# Patient Record
Sex: Male | Born: 1945 | Hispanic: Yes | Smoking: Former smoker
Health system: Southern US, Community
[De-identification: ages and names within clinical notes are randomized; demographics above are authoritative.]

## PROBLEM LIST (undated history)

## (undated) DIAGNOSIS — I10 Essential (primary) hypertension: Secondary | ICD-10-CM

## (undated) DIAGNOSIS — R0600 Dyspnea, unspecified: Secondary | ICD-10-CM

## (undated) DIAGNOSIS — E7800 Pure hypercholesterolemia, unspecified: Secondary | ICD-10-CM

## (undated) DIAGNOSIS — E119 Type 2 diabetes mellitus without complications: Secondary | ICD-10-CM

## (undated) HISTORY — PX: HERNIA REPAIR: SHX51

---

## 2002-10-18 ENCOUNTER — Encounter: Payer: Self-pay | Admitting: Orthopaedic Surgery

## 2002-10-18 ENCOUNTER — Encounter: Admission: RE | Admit: 2002-10-18 | Discharge: 2002-10-18 | Payer: Self-pay | Admitting: Orthopaedic Surgery

## 2005-10-01 ENCOUNTER — Ambulatory Visit (HOSPITAL_COMMUNITY): Admission: RE | Admit: 2005-10-01 | Discharge: 2005-10-01 | Payer: Self-pay | Admitting: General Surgery

## 2007-09-03 ENCOUNTER — Emergency Department: Payer: Self-pay | Admitting: Emergency Medicine

## 2011-03-06 ENCOUNTER — Emergency Department: Payer: Self-pay | Admitting: Emergency Medicine

## 2014-04-16 ENCOUNTER — Encounter: Payer: Self-pay | Admitting: Physician Assistant

## 2014-04-25 ENCOUNTER — Encounter: Payer: Self-pay | Admitting: Physician Assistant

## 2014-05-26 ENCOUNTER — Encounter: Payer: Self-pay | Admitting: Physician Assistant

## 2016-05-16 ENCOUNTER — Observation Stay
Admission: EM | Admit: 2016-05-16 | Discharge: 2016-05-17 | Disposition: A | Discharge status: Home / Self Care | Payer: Medicare Other | Attending: Internal Medicine | Admitting: Internal Medicine

## 2016-05-16 ENCOUNTER — Emergency Department: Payer: Medicare Other

## 2016-05-16 ENCOUNTER — Observation Stay (HOSPITAL_BASED_OUTPATIENT_CLINIC_OR_DEPARTMENT_OTHER)
Admit: 2016-05-16 | Discharge: 2016-05-16 | Disposition: A | Discharge status: Home / Self Care | Payer: Medicare Other | Attending: Internal Medicine | Admitting: Internal Medicine

## 2016-05-16 ENCOUNTER — Observation Stay: Payer: Medicare Other

## 2016-05-16 ENCOUNTER — Encounter: Payer: Self-pay | Admitting: Emergency Medicine

## 2016-05-16 DIAGNOSIS — Z833 Family history of diabetes mellitus: Secondary | ICD-10-CM | POA: Diagnosis not present

## 2016-05-16 DIAGNOSIS — R079 Chest pain, unspecified: Secondary | ICD-10-CM | POA: Clinically undetermined

## 2016-05-16 DIAGNOSIS — F0390 Unspecified dementia without behavioral disturbance: Secondary | ICD-10-CM | POA: Insufficient documentation

## 2016-05-16 DIAGNOSIS — E785 Hyperlipidemia, unspecified: Secondary | ICD-10-CM | POA: Insufficient documentation

## 2016-05-16 DIAGNOSIS — I119 Hypertensive heart disease without heart failure: Secondary | ICD-10-CM | POA: Diagnosis not present

## 2016-05-16 DIAGNOSIS — I2510 Atherosclerotic heart disease of native coronary artery without angina pectoris: Secondary | ICD-10-CM | POA: Diagnosis present

## 2016-05-16 DIAGNOSIS — Z8249 Family history of ischemic heart disease and other diseases of the circulatory system: Secondary | ICD-10-CM | POA: Diagnosis not present

## 2016-05-16 DIAGNOSIS — E669 Obesity, unspecified: Secondary | ICD-10-CM | POA: Insufficient documentation

## 2016-05-16 DIAGNOSIS — N400 Enlarged prostate without lower urinary tract symptoms: Secondary | ICD-10-CM | POA: Insufficient documentation

## 2016-05-16 DIAGNOSIS — Z79899 Other long term (current) drug therapy: Secondary | ICD-10-CM | POA: Diagnosis not present

## 2016-05-16 DIAGNOSIS — Z794 Long term (current) use of insulin: Secondary | ICD-10-CM | POA: Diagnosis not present

## 2016-05-16 DIAGNOSIS — I10 Essential (primary) hypertension: Secondary | ICD-10-CM | POA: Diagnosis not present

## 2016-05-16 DIAGNOSIS — R1011 Right upper quadrant pain: Secondary | ICD-10-CM | POA: Diagnosis present

## 2016-05-16 DIAGNOSIS — E1169 Type 2 diabetes mellitus with other specified complication: Secondary | ICD-10-CM | POA: Diagnosis present

## 2016-05-16 DIAGNOSIS — E119 Type 2 diabetes mellitus without complications: Secondary | ICD-10-CM | POA: Diagnosis not present

## 2016-05-16 DIAGNOSIS — R0789 Other chest pain: Principal | ICD-10-CM | POA: Insufficient documentation

## 2016-05-16 DIAGNOSIS — Z6837 Body mass index (BMI) 37.0-37.9, adult: Secondary | ICD-10-CM | POA: Diagnosis not present

## 2016-05-16 DIAGNOSIS — R202 Paresthesia of skin: Secondary | ICD-10-CM | POA: Diagnosis present

## 2016-05-16 DIAGNOSIS — Z87891 Personal history of nicotine dependence: Secondary | ICD-10-CM | POA: Insufficient documentation

## 2016-05-16 DIAGNOSIS — I454 Nonspecific intraventricular block: Secondary | ICD-10-CM | POA: Insufficient documentation

## 2016-05-16 DIAGNOSIS — I340 Nonrheumatic mitral (valve) insufficiency: Secondary | ICD-10-CM | POA: Insufficient documentation

## 2016-05-16 DIAGNOSIS — I700 Atherosclerosis of aorta: Secondary | ICD-10-CM | POA: Diagnosis not present

## 2016-05-16 DIAGNOSIS — I440 Atrioventricular block, first degree: Secondary | ICD-10-CM | POA: Insufficient documentation

## 2016-05-16 DIAGNOSIS — E7800 Pure hypercholesterolemia, unspecified: Secondary | ICD-10-CM | POA: Insufficient documentation

## 2016-05-16 DIAGNOSIS — R109 Unspecified abdominal pain: Secondary | ICD-10-CM

## 2016-05-16 DIAGNOSIS — Z7982 Long term (current) use of aspirin: Secondary | ICD-10-CM | POA: Insufficient documentation

## 2016-05-16 HISTORY — DX: Essential (primary) hypertension: I10

## 2016-05-16 HISTORY — DX: Pure hypercholesterolemia, unspecified: E78.00

## 2016-05-16 HISTORY — DX: Type 2 diabetes mellitus without complications: E11.9

## 2016-05-16 LAB — COMPREHENSIVE METABOLIC PANEL
ALBUMIN: 4.1 g/dL (ref 3.5–5.0)
ALK PHOS: 57 U/L (ref 38–126)
ALT: 26 U/L (ref 17–63)
AST: 32 U/L (ref 15–41)
Anion gap: 5 (ref 5–15)
BUN: 17 mg/dL (ref 6–20)
CALCIUM: 9.4 mg/dL (ref 8.9–10.3)
CHLORIDE: 100 mmol/L — AB (ref 101–111)
CO2: 32 mmol/L (ref 22–32)
CREATININE: 1.31 mg/dL — AB (ref 0.61–1.24)
GFR calc non Af Amer: 54 mL/min — ABNORMAL LOW (ref >60)
GLUCOSE: 124 mg/dL — AB (ref 65–99)
Potassium: 4.2 mmol/L (ref 3.5–5.1)
SODIUM: 137 mmol/L (ref 135–145)
Total Bilirubin: 0.3 mg/dL (ref 0.3–1.2)
Total Protein: 8 g/dL (ref 6.5–8.1)

## 2016-05-16 LAB — CBC WITH DIFFERENTIAL/PLATELET
BASOS ABS: 0.1 10*3/uL (ref 0–0.1)
BASOS PCT: 2 %
EOS ABS: 0.4 10*3/uL (ref 0–0.7)
EOS PCT: 6 %
HCT: 49 % (ref 40.0–52.0)
HEMOGLOBIN: 16.9 g/dL (ref 13.0–18.0)
LYMPHS ABS: 2.5 10*3/uL (ref 1.0–3.6)
Lymphocytes Relative: 38 %
MCH: 30.5 pg (ref 26.0–34.0)
MCHC: 34.5 g/dL (ref 32.0–36.0)
MCV: 88.4 fL (ref 80.0–100.0)
MONOS PCT: 10 %
Monocytes Absolute: 0.7 10*3/uL (ref 0.2–1.0)
NEUTROS PCT: 44 %
Neutro Abs: 2.8 10*3/uL (ref 1.4–6.5)
PLATELETS: 217 10*3/uL (ref 150–440)
RBC: 5.54 MIL/uL (ref 4.40–5.90)
RDW: 13.9 % (ref 11.5–14.5)
WBC: 6.6 10*3/uL (ref 3.8–10.6)

## 2016-05-16 LAB — TROPONIN I
Troponin I: <0.03 ng/mL (ref <0.03)
Troponin I: <0.03 ng/mL (ref <0.03)
Troponin I: <0.03 ng/mL (ref <0.03)

## 2016-05-16 LAB — ECHOCARDIOGRAM COMPLETE
HEIGHTINCHES: 65 in
Weight: 3620.8 oz

## 2016-05-16 LAB — GLUCOSE, CAPILLARY
GLUCOSE-CAPILLARY: 93 mg/dL (ref 65–99)
GLUCOSE-CAPILLARY: 173 mg/dL — AB (ref 65–99)
Glucose-Capillary: 121 mg/dL — ABNORMAL HIGH (ref 65–99)
Glucose-Capillary: 105 mg/dL — ABNORMAL HIGH (ref 65–99)

## 2016-05-16 IMAGING — CT CT HEAD W/O CM
3 series · 16 of 47 positions shown, 19 images · non-contrast
Comparison: None.

CLINICAL DATA: Bilateral hand numbness starting [REDACTED].

EXAM:
CT HEAD WITHOUT CONTRAST
TECHNIQUE: Contiguous axial images were obtained from the base of the skull
through the vertex without intravenous contrast.

[Series 2: head wo · axial · 0.41mm/px · z∈[-153,-28]mm · 10 of 31 slices shown, 13 images]
[im 3/31  brain]
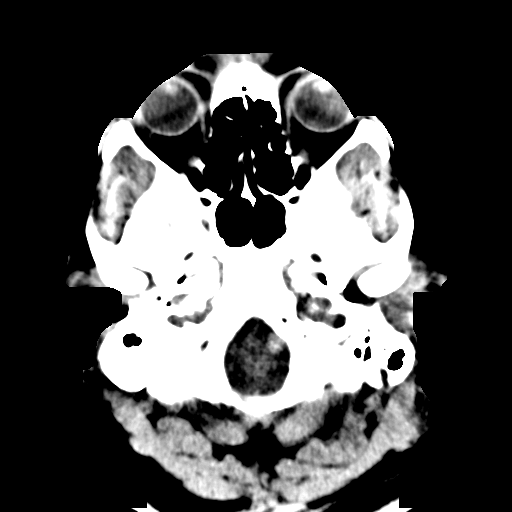
[im 3/31  bone]
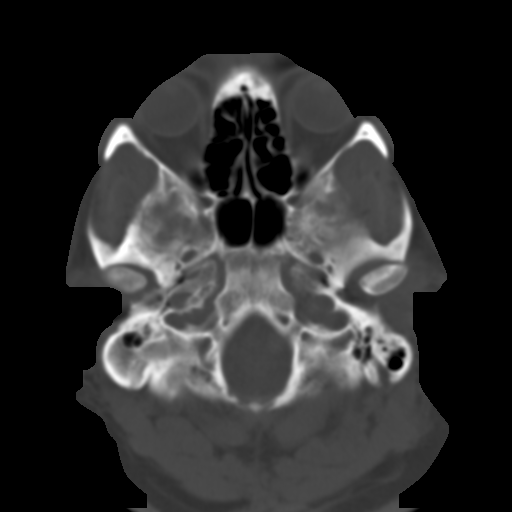
[im 6/31  brain]
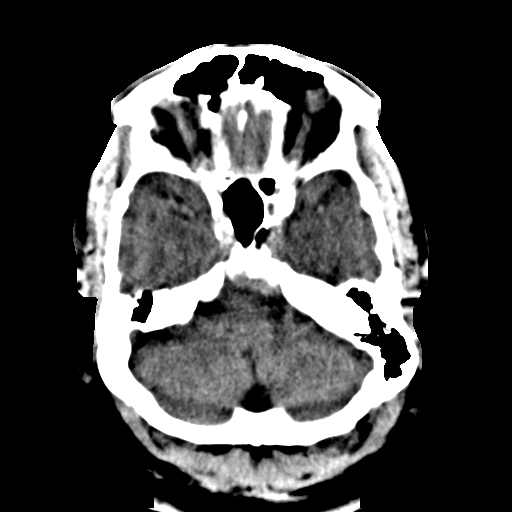
[im 9/31  brain]
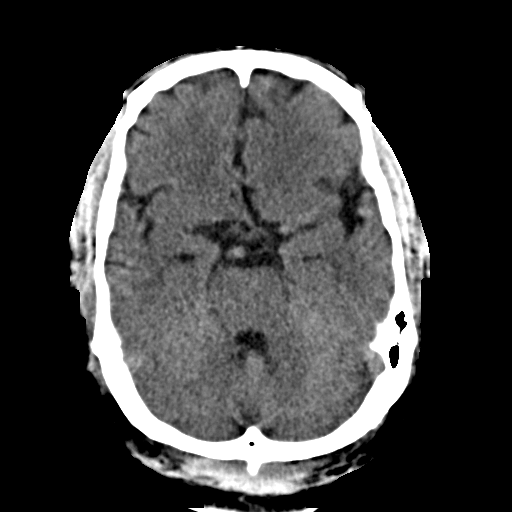
[im 11/31  brain]
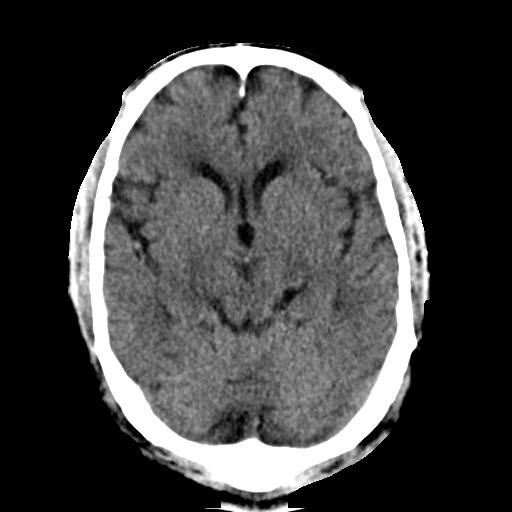
[im 14/31  brain]
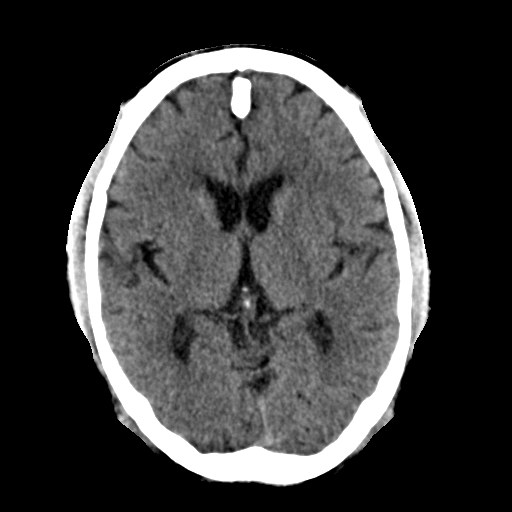
[im 14/31  bone]
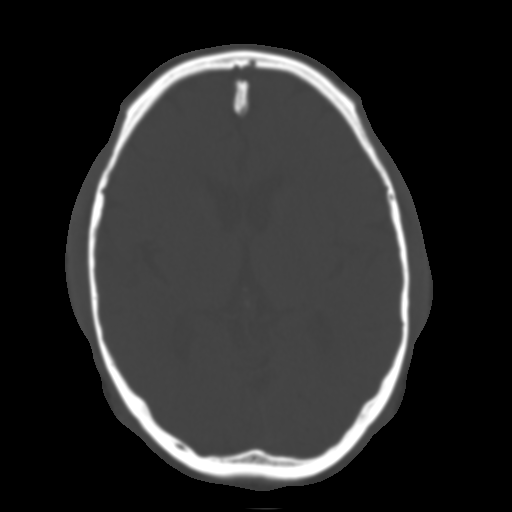
[im 17/31  brain]
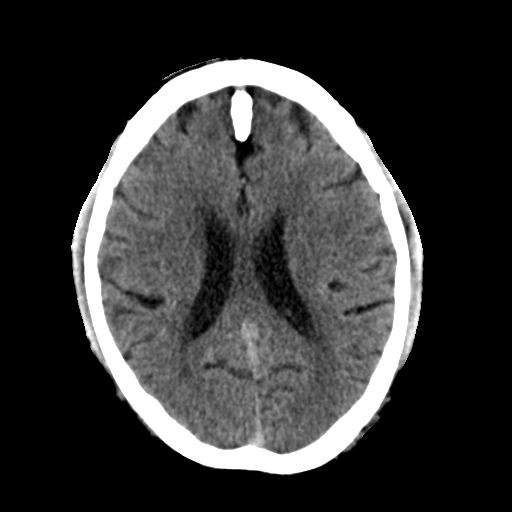
[im 20/31  brain]
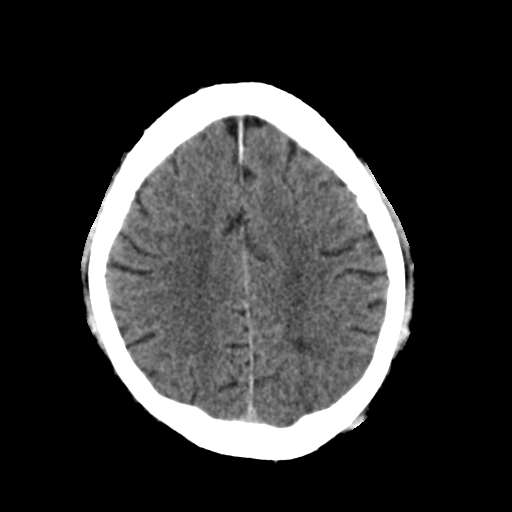
[im 23/31  brain]
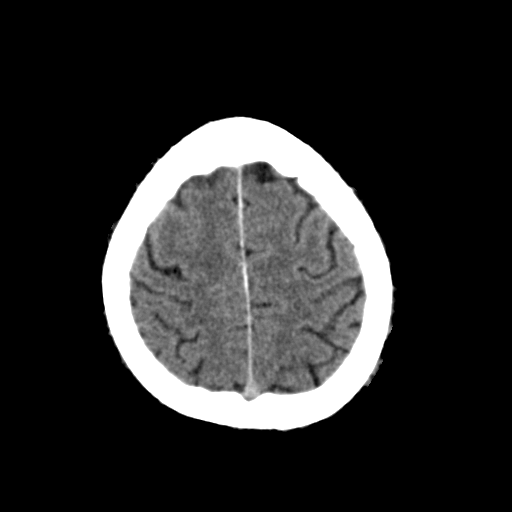
[im 25/31  brain]
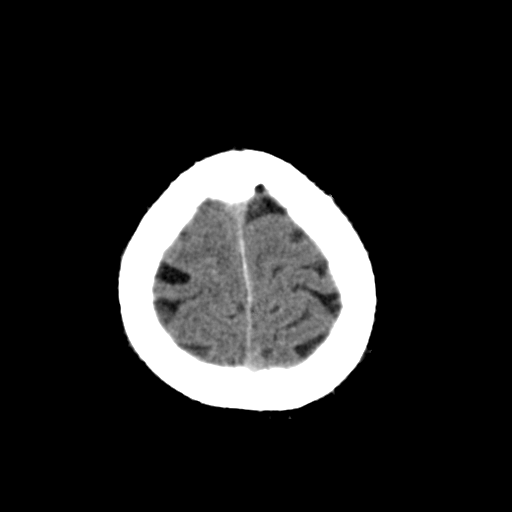
[im 25/31  bone]
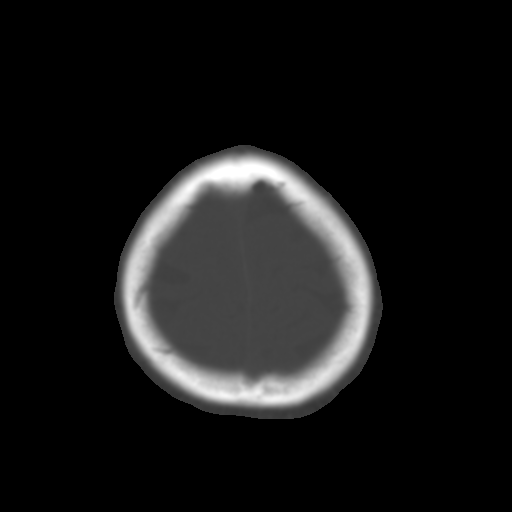
[im 28/31  brain]
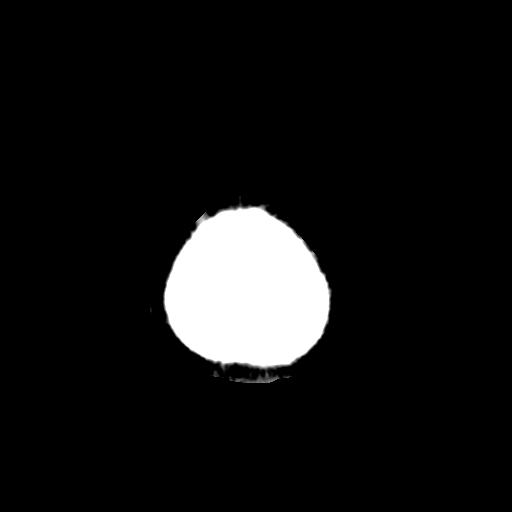

[Series 4: coronal soft · coronal · 0.30mm/px · 3 of 59 slices shown]
[im 20/59  brain]
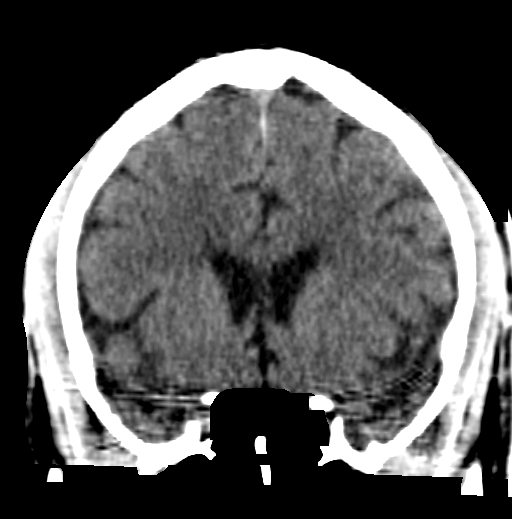
[im 26/59  brain]
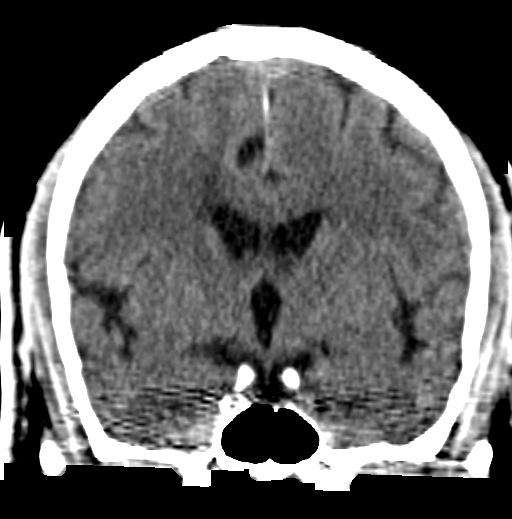
[im 33/59  brain]
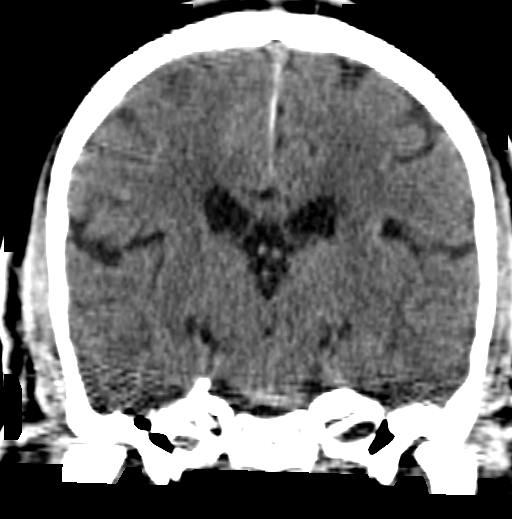

[Series 5: sagittal soft · sagittal · 0.30mm/px · 3 of 51 slices shown]
[im 17/51  brain]
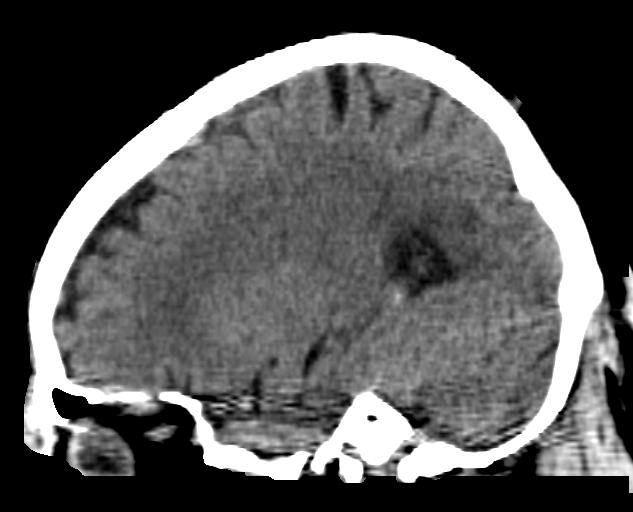
[im 26/51  brain]
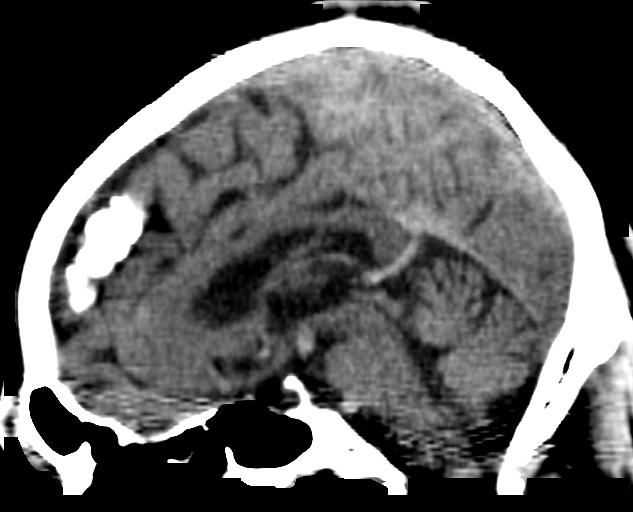
[im 34/51  brain]
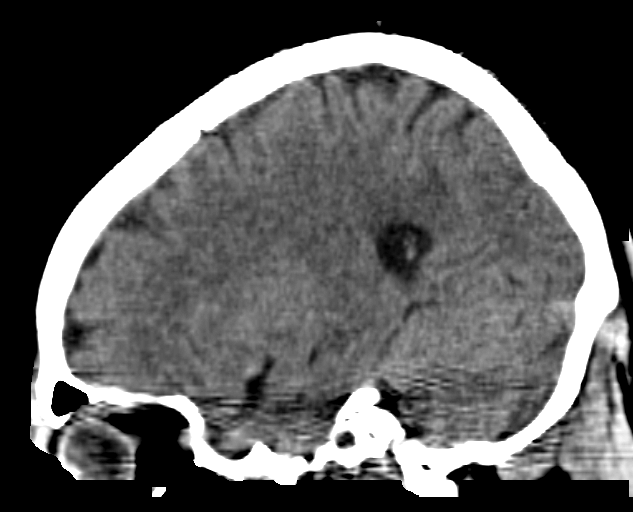

[16 of 47 positions shown; findings below may reference images not displayed]

FINDINGS: Brain: No evidence of acute infarction, hemorrhage, hydrocephalus,
or mass lesion/mass effect. Patchy chronic microvascular disease in
the cerebral white matter, mild to moderate.

Vascular: Vertebrobasilar tortuosity. Atherosclerotic calcification.
No focal hyperdense vessel.

Skull: Negative for fracture or focal lesion.

Sinuses/Orbits: No acute finding.

Other: None.
IMPRESSION: 1. No acute finding.
2. Chronic microvascular disease.

## 2016-05-16 MED ORDER — ONDANSETRON HCL 4 MG/2ML IJ SOLN
4.0000 mg | Freq: Four times a day (QID) | INTRAMUSCULAR | Status: DC | PRN
  Administered 2016-05-16 – 2016-05-17 (×2): 4 mg via INTRAVENOUS
  Filled 2016-05-16 (×2): qty 2

## 2016-05-16 MED ORDER — ONDANSETRON HCL 4 MG/2ML IJ SOLN
INTRAMUSCULAR | Status: AC
  Filled 2016-05-16: qty 2

## 2016-05-16 MED ORDER — ACETAMINOPHEN 325 MG PO TABS: 650.0000 mg | ORAL_TABLET | ORAL | Status: DC | PRN

## 2016-05-16 MED ORDER — NITROGLYCERIN 0.4 MG SL SUBL
0.4000 mg | SUBLINGUAL_TABLET | SUBLINGUAL | Status: DC | PRN
Start: 2016-05-16 — End: 2016-05-17

## 2016-05-16 MED ORDER — MORPHINE SULFATE (PF) 4 MG/ML IV SOLN
4.0000 mg | Freq: Once | INTRAVENOUS | Status: AC
  Administered 2016-05-16: 4 mg via INTRAVENOUS

## 2016-05-16 MED ORDER — SODIUM CHLORIDE 0.9 % IV SOLN: 250.0000 mL | INTRAVENOUS | Status: DC | PRN

## 2016-05-16 MED ORDER — DONEPEZIL HCL 5 MG PO TABS
10.0000 mg | ORAL_TABLET | Freq: Every day | ORAL | Status: DC
  Administered 2016-05-16 – 2016-05-17 (×2): 10 mg via ORAL
  Filled 2016-05-16 (×2): qty 2

## 2016-05-16 MED ORDER — IOPAMIDOL (ISOVUE-300) INJECTION 61%
100.0000 mL | Freq: Once | INTRAVENOUS | Status: AC | PRN
  Administered 2016-05-16: 100 mL via INTRAVENOUS

## 2016-05-16 MED ORDER — DIATRIZOATE MEGLUMINE & SODIUM 66-10 % PO SOLN
15.0000 mL | ORAL | Status: AC
  Administered 2016-05-16 (×2): 15 mL via ORAL

## 2016-05-16 MED ORDER — ENOXAPARIN SODIUM 40 MG/0.4ML ~~LOC~~ SOLN
40.0000 mg | SUBCUTANEOUS | Status: DC
  Administered 2016-05-16: 40 mg via SUBCUTANEOUS
  Filled 2016-05-16: qty 0.4

## 2016-05-16 MED ORDER — HYDRALAZINE HCL 20 MG/ML IJ SOLN
10.0000 mg | Freq: Four times a day (QID) | INTRAMUSCULAR | Status: DC | PRN
  Administered 2016-05-16: 10 mg via INTRAVENOUS

## 2016-05-16 MED ORDER — PROMETHAZINE HCL 25 MG/ML IJ SOLN
12.5000 mg | Freq: Four times a day (QID) | INTRAMUSCULAR | Status: DC | PRN
  Administered 2016-05-16: 12.5 mg via INTRAVENOUS
  Filled 2016-05-16: qty 1

## 2016-05-16 MED ORDER — ATORVASTATIN CALCIUM 20 MG PO TABS
40.0000 mg | ORAL_TABLET | Freq: Every day | ORAL | Status: DC
  Administered 2016-05-16 – 2016-05-17 (×2): 40 mg via ORAL
  Filled 2016-05-16 (×3): qty 2

## 2016-05-16 MED ORDER — CALCIUM CARBONATE-VITAMIN D 500-200 MG-UNIT PO TABS
1.0000 | ORAL_TABLET | Freq: Two times a day (BID) | ORAL | Status: DC
  Administered 2016-05-16 – 2016-05-17 (×3): 1 via ORAL
  Filled 2016-05-16 (×3): qty 1

## 2016-05-16 MED ORDER — INSULIN ASPART 100 UNIT/ML ~~LOC~~ SOLN
0.0000 [IU] | Freq: Three times a day (TID) | SUBCUTANEOUS | Status: DC
Start: 2016-05-16 — End: 2016-05-17

## 2016-05-16 MED ORDER — LISINOPRIL 20 MG PO TABS
40.0000 mg | ORAL_TABLET | Freq: Every day | ORAL | Status: DC
  Administered 2016-05-16 – 2016-05-17 (×2): 40 mg via ORAL
  Filled 2016-05-16 (×2): qty 2

## 2016-05-16 MED ORDER — ASPIRIN EC 81 MG PO TBEC
81.0000 mg | DELAYED_RELEASE_TABLET | Freq: Every day | ORAL | Status: DC
  Administered 2016-05-16 – 2016-05-17 (×2): 81 mg via ORAL
  Filled 2016-05-16 (×2): qty 1

## 2016-05-16 MED ORDER — CALCIUM 1200 1200-1000 MG-UNIT PO CHEW: CHEWABLE_TABLET | Freq: Every day | ORAL | Status: DC

## 2016-05-16 MED ORDER — ADULT MULTIVITAMIN W/MINERALS CH
1.0000 | ORAL_TABLET | Freq: Every day | ORAL | Status: DC
  Administered 2016-05-16 – 2016-05-17 (×2): 1 via ORAL
  Filled 2016-05-16 (×3): qty 1

## 2016-05-16 MED ORDER — SODIUM CHLORIDE 0.9% FLUSH
3.0000 mL | Freq: Two times a day (BID) | INTRAVENOUS | Status: DC
  Administered 2016-05-16 – 2016-05-17 (×3): 3 mL via INTRAVENOUS

## 2016-05-16 MED ORDER — ONDANSETRON HCL 4 MG/2ML IJ SOLN
4.0000 mg | Freq: Once | INTRAMUSCULAR | Status: AC
  Administered 2016-05-16: 4 mg via INTRAVENOUS

## 2016-05-16 MED ORDER — MORPHINE SULFATE (PF) 2 MG/ML IV SOLN
1.0000 mg | INTRAVENOUS | Status: DC | PRN
  Administered 2016-05-16: 1 mg via INTRAVENOUS
  Administered 2016-05-17: 2 mg via INTRAVENOUS
  Filled 2016-05-16 (×2): qty 1

## 2016-05-16 MED ORDER — MORPHINE SULFATE (PF) 4 MG/ML IV SOLN
INTRAVENOUS | Status: AC
  Filled 2016-05-16: qty 1

## 2016-05-16 MED ORDER — INSULIN ASPART 100 UNIT/ML ~~LOC~~ SOLN: 0.0000 [IU] | Freq: Every day | SUBCUTANEOUS | Status: DC

## 2016-05-16 MED ORDER — ASPIRIN 300 MG RE SUPP: 300.0000 mg | RECTAL | Status: AC

## 2016-05-16 MED ORDER — HYDRALAZINE HCL 20 MG/ML IJ SOLN
INTRAMUSCULAR | Status: AC
  Filled 2016-05-16: qty 1

## 2016-05-16 MED ORDER — ASPIRIN EC 81 MG PO TBEC: 81.0000 mg | DELAYED_RELEASE_TABLET | Freq: Every day | ORAL | Status: DC

## 2016-05-16 MED ORDER — QUINAPRIL HCL 10 MG PO TABS
40.0000 mg | ORAL_TABLET | Freq: Every day | ORAL | Status: DC
  Filled 2016-05-16: qty 4

## 2016-05-16 MED ORDER — SODIUM CHLORIDE 0.9% FLUSH: 3.0000 mL | INTRAVENOUS | Status: DC | PRN

## 2016-05-16 MED ORDER — ASPIRIN 81 MG PO CHEW
324.0000 mg | CHEWABLE_TABLET | ORAL | Status: AC
  Administered 2016-05-16: 324 mg via ORAL
  Filled 2016-05-16: qty 4

## 2016-05-16 MED ORDER — VITAMIN D 1000 UNITS PO TABS
1000.0000 [IU] | ORAL_TABLET | Freq: Every day | ORAL | Status: DC
  Administered 2016-05-16 – 2016-05-17 (×2): 1000 [IU] via ORAL
  Filled 2016-05-16 (×3): qty 1

## 2016-05-16 MED ORDER — ATENOLOL 25 MG PO TABS
25.0000 mg | ORAL_TABLET | Freq: Every day | ORAL | Status: DC
  Administered 2016-05-16: 25 mg via ORAL
  Filled 2016-05-16: qty 1

## 2016-05-16 NOTE — Consult Note (Signed)
Cardiology Consult    Patient ID: Tristan Larson MRN: [id number], DOB/AGE: December 29, 1945   Admit date: 05/16/2016 Date of Consult: 05/16/2016  Primary Physician: Presley Raddle, MD Primary Cardiologist: None, new  Requesting Provider: Adrian Saran, MD - Hospitalist Service  Patient Profile    70 year old gentleman with a history of hypertension, dyslipidemia and diabetes mellitus, type II on oral medications presenting with persistent chest pain. We are asked to consult for chest pain evaluation. He has never had a cardiac evaluation in the past. He is essentially Spanish-speaking, but his daughter provided translation. He understands some English but cannot answer fluently.   Past Medical History   Past Medical History  Diagnosis Date  . Diabetes mellitus without complication (HCC)   . Hypertension   . Hypercholesteremia     Past Surgical History  Procedure Laterality Date  . Hernia repair       Allergies  No Known Allergies  History of Present Illness    Mr. Beere was in his usual state of health until about this past Wednesday when he started noticing having pretty much persistent upper epigastric/lower chest pain. He says it did get to 8/10 at its worst. This describes a sharp stabbing sensation that starts under the ribs bilaterally and radiates to the back. It has been relieved with morphine here and he currently is pain-free.  He states that the discomfort is worse when lying down and improves with sitting up. He also states that it sometimes is worse if he takes deep inspirations or is moving about. He denies any associated dyspnea, PND or orthopnea. No exertional dyspnea.  Cardiovascular ROS: positive for - chest pain negative for - dyspnea on exertion, edema, irregular heartbeat, loss of consciousness, murmur, orthopnea, palpitations, paroxysmal nocturnal dyspnea, rapid heart rate, shortness of breath or TIA/amaurosis fugax.  Prior to Admission medications     Medication Sig Start Date End Date Taking? Authorizing Provider  aspirin EC 81 MG tablet Take 81 mg by mouth daily.   Yes Historical Provider, MD  atenolol (TENORMIN) 100 MG tablet Take 100 mg by mouth daily. 02/13/16  Yes Historical Provider, MD  atorvastatin (LIPITOR) 40 MG tablet Take 40 mg by mouth daily. 03/24/16  Yes Historical Provider, MD  Calcium Carbonate-Vit D-Min (CALCIUM 1200 PO) Take 1,200 mg by mouth daily.   Yes Historical Provider, MD  Cholecalciferol (VITAMIN D-3) 1000 units CAPS Take 1,000 Units by mouth daily.   Yes Historical Provider, MD  donepezil (ARICEPT) 10 MG tablet Take 10 mg by mouth daily. 02/13/16  Yes Historical Provider, MD  metFORMIN (GLUCOPHAGE) 500 MG tablet Take 500 mg by mouth daily. 02/13/16  Yes Historical Provider, MD  Multiple Vitamins-Minerals (MULTIVITAMIN WITH MINERALS) tablet Take 1 tablet by mouth daily.   Yes Historical Provider, MD  quinapril (ACCUPRIL) 40 MG tablet Take 40 mg by mouth daily. 02/13/16  Yes Historical Provider, MD    Inpatient Medications    . aspirin EC  81 mg Oral Daily  . atenolol  25 mg Oral Daily  . atorvastatin  40 mg Oral Daily  . calcium-vitamin D  1 tablet Oral BID  . cholecalciferol  1,000 Units Oral Daily  . donepezil  10 mg Oral Daily  . enoxaparin (LOVENOX) injection  40 mg Subcutaneous Q24H  . insulin aspart  0-15 Units Subcutaneous TID WC  . insulin aspart  0-5 Units Subcutaneous QHS  . lisinopril  40 mg Oral Daily  . multivitamin with minerals  1 tablet Oral Daily  .  sodium chloride flush  3 mL Intravenous Q12H    Family History    Family History  Problem Relation Age of Onset  . Hypertension Mother   . Diabetes Mellitus II Mother     Social History    Social History   Social History  . Marital Status: Married    Spouse Name: N/A  . Number of Children: N/A  . Years of Education: N/A   Occupational History  . retired    Social History Main Topics  . Smoking status: Former Games developer  .  Smokeless tobacco: Not on file  . Alcohol Use: No  . Drug Use: No  . Sexual Activity: Not on file   Other Topics Concern  . Not on file   Social History Narrative  . No narrative on file     Review of Systems    Review of Systems  Constitutional: Positive for malaise/fatigue.  Respiratory: Negative for cough, shortness of breath and wheezing.   Cardiovascular:       Per history of present illness  Gastrointestinal: Positive for abdominal pain (Epigastric/chest pain as noted above in history of present illness). Negative for nausea (He had nausea after he was given medications in the ER and here. Especially having taken medications without food.), vomiting, blood in stool and melena.  Genitourinary: Negative for dysuria and hematuria.  Musculoskeletal: Positive for myalgias (Left greater than right calf pain that sometimes makes it difficult for him to walk. Is not exacerbated walking to suggest claudication.). Negative for falls.  Neurological: Positive for dizziness and headaches.       Bilateral hand and feet numbness  Psychiatric/Behavioral: Negative for depression and memory loss. The patient is not nervous/anxious and does not have insomnia.   All other systems reviewed and are negative.   Physical Exam    Blood pressure 166/84, pulse 62, temperature 97.9 F (36.6 C), temperature source Oral, resp. rate 16, height 5\' 5"  (1.651 m), weight 226 lb 4.8 oz (102.649 kg), SpO2 99 %.  General: Pleasant, NAD. Psych: Normal affect. Neuro: Alert and oriented X 3. Moves all extremities spontaneously. CN II-XII grossly intact. Strength 5 out of 5.  HEENT: [place]/AT, EOMI, MMM, anicteric sclera   Neck: Supple without bruits or JVD. Lungs:  Resp regular and unlabored, CTAB. Heart: RRR. Normal S1 and S2.  no s3, s4, or murmurs.Nondisplaced PMI  Abdomen: Soft,, non-distended, BS + x 4. Mild tenderness bilateral upper quadrants and epigastric area.  Extremities: No clubbing, cyanosis or edema.  DP/PT/Radials 2+ and equal bilaterally. Skin: No obvious rashes or lesions. Warm and dry  Labs    Troponin (Point of Care Test) No results for input(s): TROPIPOC in the last 72 hours.  Recent Labs  05/16/16 0127 05/16/16 0801 05/16/16 1339  TROPONINI <0.03 <0.03 <0.03   Lab Results  Component Value Date   WBC 6.6 05/16/2016   HGB 16.9 05/16/2016   HCT 49.0 05/16/2016   MCV 88.4 05/16/2016   PLT 217 05/16/2016     Recent Labs Lab 05/16/16 0127  NA 137  K 4.2  CL 100*  CO2 32  BUN 17  CREATININE 1.31*  CALCIUM 9.4  PROT 8.0  BILITOT 0.3  ALKPHOS 57  ALT 26  AST 32  GLUCOSE 124*   No results found for: CHOL, HDL, LDLCALC, TRIG No results found for: [facility]   Radiology Studies    Dg Chest 2 View  05/16/2016  CLINICAL DATA:  Hand numbness EXAM: CHEST  2 VIEW COMPARISON:  03/06/2011 FINDINGS: Chronic borderline cardiomegaly. Stable mediastinal contours. There is no edema, consolidation, effusion, or pneumothorax. IMPRESSION: Stable.  No evidence of active disease. Electronically Signed   By: Marnee Spring M.D.   On: 05/16/2016 03:02   Ct Head Wo Contrast  05/16/2016  CLINICAL DATA:  Bilateral hand numbness starting Friday. EXAM: CT HEAD WITHOUT CONTRAST TECHNIQUE: Contiguous axial images were obtained from the base of the skull through the vertex without intravenous contrast. COMPARISON:  None. FINDINGS: Brain: No evidence of acute infarction, hemorrhage, hydrocephalus, or mass lesion/mass effect. Patchy chronic microvascular disease in the cerebral white matter, mild to moderate. Vascular: Vertebrobasilar tortuosity. Atherosclerotic calcification. No focal hyperdense vessel. Skull: Negative for fracture or focal lesion. Sinuses/Orbits: No acute finding. Other: None. IMPRESSION: 1. No acute finding. 2. Chronic microvascular disease. Electronically Signed   By: Marnee Spring M.D.   On: 05/16/2016 03:39   Ct Abdomen Pelvis W Contrast  05/16/2016  CLINICAL DATA:  Upper  abdominal and substernal chest pain. EXAM: CT ABDOMEN AND PELVIS WITH CONTRAST TECHNIQUE: Multidetector CT imaging of the abdomen and pelvis was performed using the standard protocol following bolus administration of intravenous contrast. CONTRAST:  ISOVUE-300 IOPAMIDOL (ISOVUE-300) INJECTION 61% COMPARISON:  Right upper quadrant ultrasound 05/16/2016. FINDINGS: Lower chest: Lung bases show no acute findings. Heart is mildly enlarged. Coronary artery calcification. No pericardial or pleural effusion. Hepatobiliary: Sub cm low-attenuation lesion in the left hepatic lobe is too small to characterize. Liver and gallbladder are otherwise unremarkable. No biliary ductal dilatation. Pancreas: Negative. Spleen: Negative. Adrenals/Urinary Tract: Adrenal glands are unremarkable. Sub cm low-attenuation lesions in the kidneys are too small to characterize but statistically, cysts are most likely. Ureters are decompressed. Prostate indents the bladder. Stomach/Bowel: Stomach, small bowel, appendix and colon are unremarkable. Vascular/Lymphatic: Atherosclerotic calcification of the arterial vasculature without abdominal aortic aneurysm. No pathologically enlarged lymph nodes. Reproductive: Prostate is enlarged. Other: No free fluid.  Mesenteries and peritoneum are unremarkable. Musculoskeletal: No worrisome lytic or sclerotic lesions. Degenerative changes are seen at symphysis pubis, sacroiliac joints and spine. IMPRESSION: 1. No findings to explain the patient's pain. 2. Aortic atherosclerosis and coronary artery calcification. 3. Enlarged prostate. Electronically Signed   By: Leanna Battles M.D.   On: 05/16/2016 13:05   US Abdomen Limited Ruq  05/16/2016  CLINICAL DATA:  RIGHT upper quadrant pain radiating to back for 2 days. History of hypertension, hyperlipidemia and diabetes. EXAM: US ABDOMEN LIMITED - RIGHT UPPER QUADRANT COMPARISON:  None. FINDINGS: Gallbladder: No gallstones or wall thickening visualized. No  sonographic Murphy sign noted by sonographer. Common bile duct: Diameter: 5 mm Liver: No focal lesion identified. Within normal limits in parenchymal echogenicity. Hepatopetal portal vein. IMPRESSION: Negative RIGHT upper quadrant ultrasound. Electronically Signed   By: Awilda Metro M.D.   On: 05/16/2016 05:18    ECG & Cardiac Imaging    No EKG available by report this was a nonremarkable with no ST segment changes. 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 Sounds more consistent with either musculoskeletal, GI or potentially pleuritic/pericardiac pain.   He is ruled out for MI with troponins negative 3. EKG was unremarkable and echocardiogram shows normal wall motion.  Given his cardiac risk factors of diabetes, hypertension and hyperlipidemia as well as coronary calcification, it is not unreasonable to consider that he has at least moderate risk for obstructive coronary artery disease. -> Would recommend an outpatient Myoview stress test. I will notify our scheduling department to contact the patient next week to set this up.  Treadmill Myoview stress test at [facility]  He continues to be a pretty good regimen for CAD with beta blocker,  ACE inhibitor and statin.  Continue aspirin.  There is a potential that this could be pericarditic pain. With normal renal function, could consider short course of NSAIDs for possible either muscle skeletal or pericarditis pain, however the location and radiation would make it may be more suspicious for GI etiology.   Essential hypertension /   Hypertensive heart disease without CHF  He is actually pretty good outpatient regimen. I would titrate up his atenolol to his home dose since his blood pressure still elevated.  He is on quinapril at home (would convert back to home medications and dosage on discharge)  No evidence of any heart failure to require standing loop diuretic, however he may benefit from chlorthalidone or spironolactone if his pressure remains elevated. I suspect some of this is related to pain   Hyperlipidemia associated with type 2 diabetes mellitus (HCC) -on atorvastatin.-    Diabetes mellitus type II, non insulin dependent (HCC) -- Currently on sliding scale insulin, but is on metformin at home.    Signed,  Marykay Lex, M.D., M.S.  [facility]  [address] [place], [place] [address] [phone] Fax [phone]

## 2016-05-16 NOTE — ED Provider Notes (Signed)
[facility] Emergency Department Provider Note  ___________________________________________  Time seen: Approximately 3:37 AM  I have reviewed the triage vital signs and the nursing notes.   HISTORY  Chief Complaint Numbness    HPI Tristan Larson is a 70 y.o. male who is complaining that he has had progressive worsening numbness and tingling to his bilateral hands and his bilateral lower legs and feet. Daughter states that it's been intermittent over the past 2 months and has progressively gotten worse. Patient is still able to walk without difficulty but it's worse at times. Patient 2 days ago started having pain in his midepigastric and right upper quadrant of his abdomen and radiates under his chest. Patient denies any associated shortness of breath, cough, congestion, vomiting, diarrhea, rash, or urinary symptoms. Patient states his pain on scale of 0-10 is about a 5. Patient is requesting pain medicine and will be given some IV morphine and Zofran for pain.   Past Medical History  Diagnosis Date  . Diabetes mellitus without complication (HCC)   . Hypertension   . Hypercholesteremia     There are no active problems to display for this patient.   Past Surgical History  Procedure Laterality Date  . Hernia repair      Current Outpatient Rx  Name  Route  Sig  Dispense  Refill  . aspirin EC 81 MG tablet   Oral   Take 81 mg by mouth daily.         Marland Kitchen atenolol (TENORMIN) 100 MG tablet   Oral   Take 100 mg by mouth daily.         Marland Kitchen atorvastatin (LIPITOR) 40 MG tablet   Oral   Take 40 mg by mouth daily.         . Calcium Carbonate-Vit D-Min (CALCIUM 1200 PO)   Oral   Take 1,200 mg by mouth daily.         . Cholecalciferol (VITAMIN D-3) 1000 units CAPS   Oral   Take 1,000 Units by mouth daily.         Marland Kitchen donepezil (ARICEPT) 10 MG tablet   Oral   Take 10 mg by mouth daily.         . metFORMIN (GLUCOPHAGE) 500 MG tablet    Oral   Take 500 mg by mouth daily.         . Multiple Vitamins-Minerals (MULTIVITAMIN WITH MINERALS) tablet   Oral   Take 1 tablet by mouth daily.         . quinapril (ACCUPRIL) 40 MG tablet   Oral   Take 40 mg by mouth daily.           Allergies Review of patient's allergies indicates no known allergies.  No family history on file.  Social History Social History  Substance Use Topics  . Smoking status: Former Games developer  . Smokeless tobacco: None  . Alcohol Use: Yes    Review of Systems Constitutional: No fever/chills Eyes: No visual changes. ENT: No sore throat. Cardiovascular: Denies chest pain. Respiratory: Denies shortness of breath. Gastrointestinal:Moderate right upper quadrant abdominal pain.  No nausea, no vomiting.  No diarrhea.  No constipation. Genitourinary: Negative for dysuria. Musculoskeletal: Negative for back pain. Skin: Negative for rash. Neurological: Negative for headaches, focal weakness , but he is complaining of intermittent numbness and tingling to his bilateral arms and legs.  10-point ROS otherwise negative.  ____________________________________________   PHYSICAL EXAM:  VITAL SIGNS: ED Triage Vitals  Enc Vitals Group     BP 05/16/16 0109 190/98 mmHg     Pulse Rate 05/16/16 0109 61     Resp 05/16/16 0109 20     Temp 05/16/16 0109 98.3 F (36.8 C)     Temp Source 05/16/16 0109 Oral     SpO2 05/16/16 0109 97 %     Weight 05/16/16 0109 250 lb (113.399 kg)     Height 05/16/16 0109 5\' 5"  (1.651 m)     Head Cir --      Peak Flow --      Pain Score 05/16/16 0113 8     Pain Loc --      Pain Edu? --      Excl. in GC? --     Constitutional: Alert and oriented. Well appearing and in Mild acute distress from his abdominal pain. Eyes: Conjunctivae are normal. PERRL. EOMI. Head: Atraumatic. Nose: No congestion/rhinnorhea. Mouth/Throat: Mucous membranes are moist.  Oropharynx non-erythematous. Neck: No stridor.   Cardiovascular:  Normal rate, regular rhythm. Grossly normal heart sounds.  Good peripheral circulation. Respiratory: Normal respiratory effort.  No retractions. Lungs CTAB. Gastrointestinal: Soft and tender palpation to the right upper quadrant and midepigastric area, but no sniffle or rebound guarding or peritoneal signs.. No distention. No abdominal bruits. No CVA tenderness. Musculoskeletal: No lower extremity tenderness nor edema.  No joint effusions. Neurologic:  Normal speech and language. No gross focal neurologic deficits are appreciated. No gait instability. Patient appears to move all extremities equally and does not have any significant neuro deficits at this time. Skin:  Skin is warm, dry and intact. No rash noted. Psychiatric: Mood and affect are normal. Speech and behavior are normal.  ____________________________________________   LABS (all labs ordered are listed, but only abnormal results are displayed)  Labs Reviewed  COMPREHENSIVE METABOLIC PANEL - Abnormal; Notable for the following:    Chloride 100 (*)    Glucose, Bld 124 (*)    Creatinine, Ser 1.31 (*)    GFR calc non Af Amer 54 (*)    All other components within normal limits  CBC WITH DIFFERENTIAL/PLATELET  TROPONIN I   ____________________________________________  EKG ED ECG REPORT I, Leona Carry, the attending physician, personally viewed and interpreted this ECG.   Date: 05/16/2016  EKG Time: 0121  Rate:60  Rhythm: normal sinus rhythm  Axis: Left axis deviation  Intervals:nonspecific intraventricular conduction delay  ST&T Change: Nonspecific ST and T-wave changes   ____________________________________________  RADIOLOGY Dg Chest 2 View  05/16/2016  CLINICAL DATA:  Hand numbness EXAM: CHEST  2 VIEW COMPARISON:  03/06/2011 FINDINGS: Chronic borderline cardiomegaly. Stable mediastinal contours. There is no edema, consolidation, effusion, or pneumothorax. IMPRESSION: Stable.  No evidence of active disease.  Electronically Signed   By: Marnee Spring M.D.   On: 05/16/2016 03:02   Ct Head Wo Contrast  05/16/2016  CLINICAL DATA:  Bilateral hand numbness starting Friday. EXAM: CT HEAD WITHOUT CONTRAST TECHNIQUE: Contiguous axial images were obtained from the base of the skull through the vertex without intravenous contrast. COMPARISON:  None. FINDINGS: Brain: No evidence of acute infarction, hemorrhage, hydrocephalus, or mass lesion/mass effect. Patchy chronic microvascular disease in the cerebral white matter, mild to moderate. Vascular: Vertebrobasilar tortuosity. Atherosclerotic calcification. No focal hyperdense vessel. Skull: Negative for fracture or focal lesion. Sinuses/Orbits: No acute finding. Other: None. IMPRESSION: 1. No acute finding. 2. Chronic microvascular disease. Electronically Signed   By: Marnee Spring M.D.   On: 05/16/2016 03:39  US Abdomen Limited Ruq  05/16/2016  CLINICAL DATA:  RIGHT upper quadrant pain radiating to back for 2 days. History of hypertension, hyperlipidemia and diabetes. EXAM: US ABDOMEN LIMITED - RIGHT UPPER QUADRANT COMPARISON:  None. FINDINGS: Gallbladder: No gallstones or wall thickening visualized. No sonographic Murphy sign noted by sonographer. Common bile duct: Diameter: 5 mm Liver: No focal lesion identified. Within normal limits in parenchymal echogenicity. Hepatopetal portal vein. IMPRESSION: Negative RIGHT upper quadrant ultrasound. Electronically Signed   By: Awilda Metro M.D.   On: 05/16/2016 05:18     ____________________________________________   PROCEDURES  Procedure(s) performed: None  Procedures  Critical Care performed: No  ____________________________________________   INITIAL IMPRESSION / ASSESSMENT AND PLAN / ED COURSE  Pertinent labs & imaging results that were available during my care of the patient were reviewed by me and considered in my medical decision making (see chart for details).  5:45 AM On arrival to the ER  patient had routine labs including troponin as well as a right upper quadrant ultrasound. Patient's ultrasound was negative. Patient continues to have very atypical pain up under his bilateral chest and we have consulted the hospitalist to admit the patient for cardiac rule out. Patient does have history of hypertension and diabetes. I felt that the patient's paresthesias to his hands and feet were probably secondary to diabetic neuropathy that he's never been diagnosed with. ____________________________________________   FINAL CLINICAL IMPRESSION(S) / ED DIAGNOSES  Final diagnoses:  Acute chest pain  Paresthesias      NEW MEDICATIONS STARTED DURING THIS VISIT:  New Prescriptions   No medications on file     Note:  This document was prepared using Dragon voice recognition software and may include unintentional dictation errors.    Leona Carry, MD 05/16/16 [phone]

## 2016-05-16 NOTE — Care Management Obs Status (Signed)
MEDICARE OBSERVATION STATUS NOTIFICATION   Patient Details  Name: VINAL NOSBISCH MRN: [id number] Date of Birth: 1945/11/24   Medicare Observation Status Notification Given:  Yes    Lawerance Sabal, RN 05/16/2016, 9:09 AM

## 2016-05-16 NOTE — ED Notes (Addendum)
Patient to ED with complaint of bilateral hand numbness that started Friday morning.  Also reports having pain under rib area for the past 2 days.  Patient alert and oriented x4, patient with no palmar drift noted, is able to feel touch, slight difference in right hand grip versus left hand grip.  During triage patient also reports that both feet feel like his hands but that has been going on for approximately 2 months.

## 2016-05-16 NOTE — H&P (Signed)
[facility] - [place] at [facility]   PATIENT NAME: Tristan Larson    MR#:  [id number]  DATE OF BIRTH:  1946-02-26  DATE OF ADMISSION:  05/16/2016  PRIMARY CARE PHYSICIAN: Presley Raddle, MD   REQUESTING/REFERRING PHYSICIAN:   CHIEF COMPLAINT:   Chief Complaint  Patient presents with  . Numbness    HISTORY OF PRESENT ILLNESS: Tristan Larson  is a 70 y.o. male with a known history of diabetes mellitus type 2, hypertension, hyperlipidemia presented to the emergency room with chest pain which is been going on since Wednesday. The chest pain is located in the front of back of chest, and the pain is sharp in nature 8 out of 10 on a scale of 1-10. No complaints of any shortness of breath. No history of any nausea vomiting. No history of any palpitations. No history of any orthopnea. Patient never had any recent cardiac workup done. Patient also complains of numbness in the hands and feet which is been going on for a while. Patient has history of diabetes mellitus and currently on oral metformin. No history of any weakness in any part of the body. No complaints of any slurred speech. No history of headache dizziness or blurry vision. First set of troponin was negative and EKG normal sinus rhythm with no ST segment changes. Hospitalist service was consulted for further care of the patient.  PAST MEDICAL HISTORY:   Past Medical History  Diagnosis Date  . Diabetes mellitus without complication (HCC)   . Hypertension   . Hypercholesteremia     PAST SURGICAL HISTORY: Past Surgical History  Procedure Laterality Date  . Hernia repair      SOCIAL HISTORY:  Social History  Substance Use Topics  . Smoking status: Former Games developer  . Smokeless tobacco: Not on file  . Alcohol Use: No    FAMILY HISTORY:  Family History  Problem Relation Age of Onset  . Hypertension Mother   . Diabetes Mellitus II Mother     DRUG ALLERGIES: No Known Allergies  REVIEW OF SYSTEMS:    CONSTITUTIONAL: No fever, fatigue or weakness.  EYES: No blurred or double vision.  EARS, NOSE, AND THROAT: No tinnitus or ear pain.  RESPIRATORY: No cough, shortness of breath, wheezing or hemoptysis.  CARDIOVASCULAR: Has chest pain, no orthopnea, edema.  GASTROINTESTINAL: No nausea, vomiting, diarrhea or abdominal pain.  GENITOURINARY: No dysuria, hematuria.  ENDOCRINE: No polyuria, nocturia,  HEMATOLOGY: No anemia, easy bruising or bleeding SKIN: No rash or lesion. MUSCULOSKELETAL: No joint pain or arthritis. Has numbness of hands and feet NEUROLOGIC: No tingling,has numbness of hands and feet, no weakness.  PSYCHIATRY: No anxiety or depression.   MEDICATIONS AT HOME:  Prior to Admission medications   Medication Sig Start Date End Date Taking? Authorizing Provider  aspirin EC 81 MG tablet Take 81 mg by mouth daily.   Yes Historical Provider, MD  atenolol (TENORMIN) 100 MG tablet Take 100 mg by mouth daily. 02/13/16  Yes Historical Provider, MD  atorvastatin (LIPITOR) 40 MG tablet Take 40 mg by mouth daily. 03/24/16  Yes Historical Provider, MD  Calcium Carbonate-Vit D-Min (CALCIUM 1200 PO) Take 1,200 mg by mouth daily.   Yes Historical Provider, MD  Cholecalciferol (VITAMIN D-3) 1000 units CAPS Take 1,000 Units by mouth daily.   Yes Historical Provider, MD  donepezil (ARICEPT) 10 MG tablet Take 10 mg by mouth daily. 02/13/16  Yes Historical Provider, MD  metFORMIN (GLUCOPHAGE) 500 MG tablet Take 500 mg by mouth  daily. 02/13/16  Yes Historical Provider, MD  Multiple Vitamins-Minerals (MULTIVITAMIN WITH MINERALS) tablet Take 1 tablet by mouth daily.   Yes Historical Provider, MD  quinapril (ACCUPRIL) 40 MG tablet Take 40 mg by mouth daily. 02/13/16  Yes Historical Provider, MD      PHYSICAL EXAMINATION:   VITAL SIGNS: Blood pressure 172/92, pulse 53, temperature 98.3 F (36.8 C), temperature source Oral, resp. rate 26, height 5\' 5"  (1.651 m), weight 113.399 kg (250 lb), SpO2 100  %.  GENERAL:  70 y.o.-year-old well built male  patient lying in the bed with no acute distress.  EYES: Pupils equal, round, reactive to light and accommodation. No scleral icterus. Extraocular muscles intact.  HEENT: Head atraumatic, normocephalic. Oropharynx and nasopharynx clear.  NECK:  Supple, no jugular venous distention. No thyroid enlargement, no tenderness.  LUNGS: Normal breath sounds bilaterally, no wheezing, rales,rhonchi or crepitation. No use of accessory muscles of respiration.  CARDIOVASCULAR: S1, S2 normal. No murmurs, rubs, or gallops.  ABDOMEN: Soft, nontender, nondistended. Bowel sounds present. No organomegaly or mass.  EXTREMITIES: No pedal edema, cyanosis, or clubbing.  NEUROLOGIC: Cranial nerves II through XII are intact. Muscle strength 5/5 in all extremities. Sensation intact. Gait normal. PSYCHIATRIC: The patient is alert and oriented x 3.  SKIN: No obvious rash, lesion, or ulcer.   LABORATORY PANEL:   CBC  Recent Labs Lab 05/16/16 0127  WBC 6.6  HGB 16.9  HCT 49.0  PLT 217  MCV 88.4  MCH 30.5  MCHC 34.5  RDW 13.9  LYMPHSABS 2.5  MONOABS 0.7  EOSABS 0.4  BASOSABS 0.1   ------------------------------------------------------------------------------------------------------------------  Chemistries   Recent Labs Lab 05/16/16 0127  NA 137  K 4.2  CL 100*  CO2 32  GLUCOSE 124*  BUN 17  CREATININE 1.31*  CALCIUM 9.4  AST 32  ALT 26  ALKPHOS 57  BILITOT 0.3   ------------------------------------------------------------------------------------------------------------------ estimated creatinine clearance is 62.8 mL/min (by C-G formula based on Cr of 1.31). ------------------------------------------------------------------------------------------------------------------ No results for input(s): TSH, T4TOTAL, T3FREE, THYROIDAB in the last 72 hours.  Invalid input(s): FREET3   Coagulation profile No results for input(s): INR, PROTIME in the  last 168 hours. ------------------------------------------------------------------------------------------------------------------- No results for input(s): DDIMER in the last 72 hours. -------------------------------------------------------------------------------------------------------------------  Cardiac Enzymes  Recent Labs Lab 05/16/16 0127  TROPONINI <0.03   ------------------------------------------------------------------------------------------------------------------ Invalid input(s): POCBNP  ---------------------------------------------------------------------------------------------------------------  Urinalysis No results found for: COLORURINE, APPEARANCEUR, LABSPEC, PHURINE, GLUCOSEU, HGBUR, BILIRUBINUR, KETONESUR, PROTEINUR, UROBILINOGEN, NITRITE, LEUKOCYTESUR   RADIOLOGY: Dg Chest 2 View  05/16/2016  CLINICAL DATA:  Hand numbness EXAM: CHEST  2 VIEW COMPARISON:  03/06/2011 FINDINGS: Chronic borderline cardiomegaly. Stable mediastinal contours. There is no edema, consolidation, effusion, or pneumothorax. IMPRESSION: Stable.  No evidence of active disease. Electronically Signed   By: Marnee Spring M.D.   On: 05/16/2016 03:02   Ct Head Wo Contrast  05/16/2016  CLINICAL DATA:  Bilateral hand numbness starting Friday. EXAM: CT HEAD WITHOUT CONTRAST TECHNIQUE: Contiguous axial images were obtained from the base of the skull through the vertex without intravenous contrast. COMPARISON:  None. FINDINGS: Brain: No evidence of acute infarction, hemorrhage, hydrocephalus, or mass lesion/mass effect. Patchy chronic microvascular disease in the cerebral white matter, mild to moderate. Vascular: Vertebrobasilar tortuosity. Atherosclerotic calcification. No focal hyperdense vessel. Skull: Negative for fracture or focal lesion. Sinuses/Orbits: No acute finding. Other: None. IMPRESSION: 1. No acute finding. 2. Chronic microvascular disease. Electronically Signed   By: Marnee Spring M.D.    On: 05/16/2016 03:39   US  Abdomen Limited Ruq  05/16/2016  CLINICAL DATA:  RIGHT upper quadrant pain radiating to back for 2 days. History of hypertension, hyperlipidemia and diabetes. EXAM: US ABDOMEN LIMITED - RIGHT UPPER QUADRANT COMPARISON:  None. FINDINGS: Gallbladder: No gallstones or wall thickening visualized. No sonographic Murphy sign noted by sonographer. Common bile duct: Diameter: 5 mm Liver: No focal lesion identified. Within normal limits in parenchymal echogenicity. Hepatopetal portal vein. IMPRESSION: Negative RIGHT upper quadrant ultrasound. Electronically Signed   By: Awilda Metro M.D.   On: 05/16/2016 05:18    EKG: Orders placed or performed during the hospital encounter of 05/16/16  . ED EKG  . ED EKG    IMPRESSION AND PLAN: 70 year old male patient with history of hypertension, diabetes mellitus type 2, hyperlipidemia presented to the emergency room with chest discomfort and numbness in the hands and feet. Admitting diagnosis 1. Atypical chest pain 2. 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 Type 2 diabetes mellitus 4. Hypertension Treatment plan Admit patient to telemetry observation bed Aspirin 81 mg orally daily When necessary nitrates for chest pain Check echocardiogram Cycle troponin to rule out ischemia Cardiology evaluation DVT prophylaxis subcutaneous Lovenox 40 MG daily Control blood sugars.  All the records are reviewed and case discussed with ED provider. Management plans discussed with the patient, family and they are in agreement.  CODE STATUS:FULL Code Status History    This patient does not have a recorded code status. Please follow your organizational policy for patients in this situation.       TOTAL TIME TAKING CARE OF THIS PATIENT: 50 minutes.    Ihor Austin M.D on 05/16/2016 at 6:25 AM  Between 7am to 6pm - Pager - [phone]  After 6pm go to [email or web address] - password [email or web address] [facility]  [place]  [facility]  Office  [phone]  CC: Primary care physician; Presley Raddle, MD

## 2016-05-16 NOTE — Progress Notes (Signed)
[facility] - [place] at [facility]   PATIENT NAME: Tristan Larson    MR#:  [id number]  DATE OF BIRTH:  13-May-1946  SUBJECTIVE:   Patient here with upper abdominal pain/substernal chest pain. Morphine alleviates pain. No exacerbating factors. No heartburn symptoms. No back pain.  REVIEW OF SYSTEMS:    Review of Systems  Constitutional: Negative for fever, chills and malaise/fatigue.  HENT: Negative for ear discharge, ear pain, hearing loss, nosebleeds and sore throat.   Eyes: Negative for blurred vision and pain.  Respiratory: Negative for cough, hemoptysis, shortness of breath and wheezing.   Cardiovascular: Negative for chest pain, palpitations and leg swelling.  Gastrointestinal: Positive for abdominal pain. Negative for nausea, vomiting, diarrhea and blood in stool.  Genitourinary: Negative for dysuria.  Musculoskeletal: Negative for back pain.  Neurological: Negative for dizziness, tremors, speech change, focal weakness, seizures and headaches.  Endo/Heme/Allergies: Does not bruise/bleed easily.  Psychiatric/Behavioral: Negative for depression, suicidal ideas and hallucinations.    Tolerating Diet: yes      DRUG ALLERGIES:  No Known Allergies  VITALS:  Blood pressure 166/79, pulse 59, temperature 98.3 F (36.8 C), temperature source Oral, resp. rate 20, height  (1.651 m), weight 102.649 kg (226 lb 4.8 oz), SpO2 99 %.  PHYSICAL EXAMINATION:   Physical Exam  Constitutional: He is oriented to person, place, and time and well-developed, well-nourished, and in no distress. No distress.  HENT:  Head: Normocephalic.  Eyes: No scleral icterus.  Neck: Normal range of motion. Neck supple. No JVD present. No tracheal deviation present.  Cardiovascular: Normal rate, regular rhythm and normal heart sounds.  Exam reveals no gallop and no friction rub.   No murmur heard. Pulmonary/Chest: Effort normal and breath sounds normal. No respiratory distress. He has  no wheezes. He has no rales. He exhibits no tenderness.  Abdominal: Soft. Bowel sounds are normal. He exhibits no distension and no mass. There is no tenderness. There is no rebound and no guarding.  Musculoskeletal: Normal range of motion. He exhibits no edema.  Neurological: He is alert and oriented to person, place, and time.  Skin: Skin is warm. No rash noted. No erythema.  Psychiatric: Affect and judgment normal.      LABORATORY PANEL:   CBC  Recent Labs Lab 05/16/16 0127  WBC 6.6  HGB 16.9  HCT 49.0  PLT 217   ------------------------------------------------------------------------------------------------------------------  Chemistries   Recent Labs Lab 05/16/16 0127  NA 137  K 4.2  CL 100*  CO2 32  GLUCOSE 124*  BUN 17  CREATININE 1.31*  CALCIUM 9.4  AST 32  ALT 26  ALKPHOS 57  BILITOT 0.3   ------------------------------------------------------------------------------------------------------------------  Cardiac Enzymes  Recent Labs Lab 05/16/16 0127 05/16/16 0801  TROPONINI <0.03 <0.03   ------------------------------------------------------------------------------------------------------------------  RADIOLOGY:  Dg Chest 2 View  05/16/2016  CLINICAL DATA:  Hand numbness EXAM: CHEST  2 VIEW COMPARISON:  03/06/2011 FINDINGS: Chronic borderline cardiomegaly. Stable mediastinal contours. There is no edema, consolidation, effusion, or pneumothorax. IMPRESSION: Stable.  No evidence of active disease. Electronically Signed   By: Marnee Spring M.D.   On: 05/16/2016 03:02   Ct Head Wo Contrast  05/16/2016  CLINICAL DATA:  Bilateral hand numbness starting Friday. EXAM: CT HEAD WITHOUT CONTRAST TECHNIQUE: Contiguous axial images were obtained from the base of the skull through the vertex without intravenous contrast. COMPARISON:  None. FINDINGS: Brain: No evidence of acute infarction, hemorrhage, hydrocephalus, or mass lesion/mass effect. Patchy chronic  microvascular disease in the cerebral  white matter, mild to moderate. Vascular: Vertebrobasilar tortuosity. Atherosclerotic calcification. No focal hyperdense vessel. Skull: Negative for fracture or focal lesion. Sinuses/Orbits: No acute finding. Other: None. IMPRESSION: 1. No acute finding. 2. Chronic microvascular disease. Electronically Signed   By: Marnee Spring M.D.   On: 05/16/2016 03:39   US Abdomen Limited Ruq  05/16/2016  CLINICAL DATA:  RIGHT upper quadrant pain radiating to back for 2 days. History of hypertension, hyperlipidemia and diabetes. EXAM: US ABDOMEN LIMITED - RIGHT UPPER QUADRANT COMPARISON:  None. FINDINGS: Gallbladder: No gallstones or wall thickening visualized. No sonographic Murphy sign noted by sonographer. Common bile duct: Diameter: 5 mm Liver: No focal lesion identified. Within normal limits in parenchymal echogenicity. Hepatopetal portal vein. IMPRESSION: Negative RIGHT upper quadrant ultrasound. Electronically Signed   By: Awilda Metro M.D.   On: 05/16/2016 05:18     ASSESSMENT AND PLAN:   70 year old male with a history of hypertension and hyperlipidemia who presented with upper abdominal pain  1. Upper abdominal/substernal chest pain: Patient was admitted to telemetry to evaluate for cardiac disease. Right upper quadrant ultrasound negative. Order CT scan of abdomen. Follow up on cardiology consult for atypical chest pain. Will need outpatient stress test likely. Follow up on echocardiogram.  Cycle cardiac enzymes.  2. Essential hypertension: Continue lisinopril and atenolol.  3. Hyperlipidemia: Continue statin.  4. Mild dementia: Continue donepezil.  5. Diabetes: Continue sliding scale insulin and ADA diet. Metformin on hold.  Management plans discussed with the patient and he  is in agreement.  CODE STATUS: Full  TOTAL TIME TAKING CARE OF THIS PATIENT: 30  minutes.     POSSIBLE D/C tomorrow, DEPENDING ON CLINICAL CONDITION.   Caliah Kopke,  Philopater Mucha M.D on 05/16/2016 at 10:31 AM  Between 7am to 6pm - Pager - [phone] After 6pm go to [email or web address] - password [email or web address]  [facility]  Office  [phone]  CC: Primary care physician; Presley Raddle, MD  Note: This dictation was prepared with Dragon dictation along with smaller phrase technology. Any transcriptional errors that result from this process are unintentional.

## 2016-05-17 LAB — GLUCOSE, CAPILLARY: Glucose-Capillary: 107 mg/dL — ABNORMAL HIGH (ref 65–99)

## 2016-05-17 LAB — BASIC METABOLIC PANEL
ANION GAP: 8 (ref 5–15)
BUN: 28 mg/dL — AB (ref 6–20)
CO2: 27 mmol/L (ref 22–32)
Calcium: 9.1 mg/dL (ref 8.9–10.3)
Chloride: 102 mmol/L (ref 101–111)
Creatinine, Ser: 1.69 mg/dL — ABNORMAL HIGH (ref 0.61–1.24)
GFR calc Af Amer: 46 mL/min — ABNORMAL LOW (ref >60)
GFR, EST NON AFRICAN AMERICAN: 40 mL/min — AB (ref >60)
GLUCOSE: 125 mg/dL — AB (ref 65–99)
POTASSIUM: 3.8 mmol/L (ref 3.5–5.1)
Sodium: 137 mmol/L (ref 135–145)

## 2016-05-17 LAB — LIPID PANEL
CHOL/HDL RATIO: 3 ratio
Cholesterol: 138 mg/dL (ref 0–200)
HDL: 46 mg/dL (ref >40)
LDL CALC: 68 mg/dL (ref 0–99)
Triglycerides: 120 mg/dL (ref <150)
VLDL: 24 mg/dL (ref 0–40)

## 2016-05-17 MED ORDER — NAPROXEN SODIUM 220 MG PO TABS: 220.0000 mg | ORAL_TABLET | Freq: Two times a day (BID) | ORAL | 0 refills | Status: AC

## 2016-05-17 MED ORDER — IBUPROFEN 400 MG PO TABS
600.0000 mg | ORAL_TABLET | Freq: Once | ORAL | Status: AC
  Administered 2016-05-17: 600 mg via ORAL
  Filled 2016-05-17: qty 2

## 2016-05-17 MED ORDER — ATENOLOL 25 MG PO TABS: 25.0000 mg | ORAL_TABLET | Freq: Every day | ORAL | 0 refills | Status: AC

## 2016-05-17 NOTE — Progress Notes (Signed)
   See yesterday's Consult note for recommendations.  I have notified our office staff re: scheduling OP ST & f/u appt.  BP stable.    Will sign off for now.  Bryan Lemma, MD Marykay Lex, M.D., M.S.  [facility]  [address] [place], [place] [address] [phone] Fax [phone]

## 2016-05-17 NOTE — Progress Notes (Signed)
Pt alert and oriented x4, no complaints of pain or discomfort.  Bed in low position, call bell within reach.  Bed alarms on and functioning.  Assessment done and charted.  Will continue to monitor and do hourly rounding throughout the shift 

## 2016-05-17 NOTE — Discharge Summary (Signed)
[facility] [place] [facility]   PATIENT NAME: Tristan Larson    MR#:  [id number]  DATE OF BIRTH:  12-31-1945  DATE OF ADMISSION:  05/16/2016 ADMITTING PHYSICIAN: Ihor Austin, MD  DATE OF DISCHARGE: 05/17/2016  PRIMARY CARE PHYSICIAN: Presley Raddle, MD    ADMISSION DIAGNOSIS:  Paresthesias [R20.2] RUQ pain [R10.11] Acute chest pain [R07.9]  DISCHARGE DIAGNOSIS:  Principal Problem:   Abdominal pain Active Problems:   Coronary artery calcification seen on CAT scan   Essential hypertension   Diabetes mellitus type II, non insulin dependent (HCC)   Hyperlipidemia associated with type 2 diabetes mellitus (HCC)   Hypertensive heart disease without CHF   SECONDARY DIAGNOSIS:   Past Medical History:  Diagnosis Date  . Diabetes mellitus without complication (HCC)   . Hypercholesteremia   . Hypertension     HOSPITAL COURSE:   70 year old male with a history of hypertension and hyperlipidemia who presented with upper abdominal pain  1. Upper abdominal/substernal chest pain: Patient was admitted to rule out cardiac etiology of his atypical pain. Cardiology was also consulted. He was ruled out for ACS with troponins negative 3. EKG was unremarkable. Echocardiogram showed normal wall motion without major abnormalities.  Due to his hyperlipidemia and diabetes he will need outpatient treadmill Myoview which will be scheduled for an this week. His symptoms are more consistent with musculoskeletal pain. He underwent CT scan of abdomen which essentially was normal as well as right upper card ultrasound did not reveal etiology of pain.   2. Essential hypertension: Due to low heart rate metoprolol was decreased to 25 mg. He will continue with ACE inhibitor.   3. Hyperlipidemia: Continue statin.  4. Mild dementia: Continue donepezil.  5. Diabetes: He may continue outpatient medications.Marland Kitchen  DISCHARGE CONDITIONS AND DIET:   Stable for discharge on diabetic  diet  CONSULTS OBTAINED:  Treatment Team:  Marykay Lex, MD  DRUG ALLERGIES:  No Known Allergies  DISCHARGE MEDICATIONS:   Current Discharge Medication List    START taking these medications   Details  naproxen sodium (ALEVE) 220 MG tablet Take 1 tablet (220 mg total) by mouth 2 (two) times daily with a meal. Qty: 20 tablet, Refills: 0      CONTINUE these medications which have CHANGED   Details  atenolol (TENORMIN) 25 MG tablet Take 1 tablet (25 mg total) by mouth daily. Qty: 30 tablet, Refills: 0      CONTINUE these medications which have NOT CHANGED   Details  aspirin EC 81 MG tablet Take 81 mg by mouth daily.    atorvastatin (LIPITOR) 40 MG tablet Take 40 mg by mouth daily.    Calcium Carbonate-Vit D-Min (CALCIUM 1200 PO) Take 1,200 mg by mouth daily.    Cholecalciferol (VITAMIN D-3) 1000 units CAPS Take 1,000 Units by mouth daily.    donepezil (ARICEPT) 10 MG tablet Take 10 mg by mouth daily.    metFORMIN (GLUCOPHAGE) 500 MG tablet Take 500 mg by mouth daily.    Multiple Vitamins-Minerals (MULTIVITAMIN WITH MINERALS) tablet Take 1 tablet by mouth daily.    quinapril (ACCUPRIL) 40 MG tablet Take 40 mg by mouth daily.              Today   CHIEF COMPLAINT:   Doing fairly well this morning. Continues to have some musculoskeletal right upper quadrant pain.   VITAL SIGNS:  Blood pressure (!) 137/93, pulse (!) 51, temperature 98.4 F (36.9 C), temperature source Oral, resp. rate 18,  height  (1.651 m), weight 102.6 kg (226 lb 4.8 oz), SpO2 99 %.   REVIEW OF SYSTEMS:  Review of Systems  Constitutional: Negative.  Negative for chills, fever and malaise/fatigue.  HENT: Negative.  Negative for ear discharge, ear pain, hearing loss, nosebleeds and sore throat.   Eyes: Negative.  Negative for blurred vision and pain.  Respiratory: Negative.  Negative for cough, hemoptysis, shortness of breath and wheezing.   Cardiovascular: Negative.  Negative for  chest pain, palpitations and leg swelling.  Gastrointestinal: Positive for abdominal pain. Negative for blood in stool, diarrhea, nausea and vomiting.  Genitourinary: Negative.  Negative for dysuria.  Musculoskeletal: Negative.  Negative for back pain.  Skin: Negative.   Neurological: Negative for dizziness, tremors, speech change, focal weakness, seizures and headaches.  Endo/Heme/Allergies: Negative.  Does not bruise/bleed easily.  Psychiatric/Behavioral: Negative.  Negative for depression, hallucinations and suicidal ideas.     PHYSICAL EXAMINATION:  GENERAL:  70 y.o.-year-old patient lying in the bed with no acute distress.  NECK:  Supple, no jugular venous distention. No thyroid enlargement, no tenderness.  LUNGS: Normal breath sounds bilaterally, no wheezing, rales,rhonchi  No use of accessory muscles of respiration.  CARDIOVASCULAR: S1, S2 normal. No murmurs, rubs, or gallops.  ABDOMEN: Soft, non-tender, non-distended. Bowel sounds present. No organomegaly or mass.  EXTREMITIES: No pedal edema, cyanosis, or clubbing.  PSYCHIATRIC: The patient is alert and oriented x 3.  SKIN: No obvious rash, lesion, or ulcer.   DATA REVIEW:   CBC  Recent Labs Lab 05/16/16 0127  WBC 6.6  HGB 16.9  HCT 49.0  PLT 217    Chemistries   Recent Labs Lab 05/16/16 0127 05/17/16 0517  NA 137 137  K 4.2 3.8  CL 100* 102  CO2 32 27  GLUCOSE 124* 125*  BUN 17 28*  CREATININE 1.31* 1.69*  CALCIUM 9.4 9.1  AST 32  --   ALT 26  --   ALKPHOS 57  --   BILITOT 0.3  --     Cardiac Enzymes  Recent Labs Lab 05/16/16 0801 05/16/16 1339 05/16/16 1953  TROPONINI <0.03 <0.03 <0.03    Microbiology Results  @  RADIOLOGY:  Dg Chest 2 View  Result Date: 05/16/2016 CLINICAL DATA:  Hand numbness EXAM: CHEST  2 VIEW COMPARISON:  03/06/2011 FINDINGS: Chronic borderline cardiomegaly. Stable mediastinal contours. There is no edema, consolidation, effusion, or pneumothorax.  IMPRESSION: Stable.  No evidence of active disease. Electronically Signed   By: Marnee Spring M.D.   On: 05/16/2016 03:02   Ct Head Wo Contrast  Result Date: 05/16/2016 CLINICAL DATA:  Bilateral hand numbness starting Friday. EXAM: CT HEAD WITHOUT CONTRAST TECHNIQUE: Contiguous axial images were obtained from the base of the skull through the vertex without intravenous contrast. COMPARISON:  None. FINDINGS: Brain: No evidence of acute infarction, hemorrhage, hydrocephalus, or mass lesion/mass effect. Patchy chronic microvascular disease in the cerebral white matter, mild to moderate. Vascular: Vertebrobasilar tortuosity. Atherosclerotic calcification. No focal hyperdense vessel. Skull: Negative for fracture or focal lesion. Sinuses/Orbits: No acute finding. Other: None. IMPRESSION: 1. No acute finding. 2. Chronic microvascular disease. Electronically Signed   By: Marnee Spring M.D.   On: 05/16/2016 03:39   Ct Abdomen Pelvis W Contrast  Result Date: 05/16/2016 CLINICAL DATA:  Upper abdominal and substernal chest pain. EXAM: CT ABDOMEN AND PELVIS WITH CONTRAST TECHNIQUE: Multidetector CT imaging of the abdomen and pelvis was performed using the standard protocol following bolus administration of intravenous contrast. CONTRAST:   ISOVUE-300 IOPAMIDOL (ISOVUE-300) INJECTION 61% COMPARISON:  Right upper quadrant ultrasound 05/16/2016. FINDINGS: Lower chest: Lung bases show no acute findings. Heart is mildly enlarged. Coronary artery calcification. No pericardial or pleural effusion. Hepatobiliary: Sub cm low-attenuation lesion in the left hepatic lobe is too small to characterize. Liver and gallbladder are otherwise unremarkable. No biliary ductal dilatation. Pancreas: Negative. Spleen: Negative. Adrenals/Urinary Tract: Adrenal glands are unremarkable. Sub cm low-attenuation lesions in the kidneys are too small to characterize but statistically, cysts are most likely. Ureters are decompressed. Prostate  indents the bladder. Stomach/Bowel: Stomach, small bowel, appendix and colon are unremarkable. Vascular/Lymphatic: Atherosclerotic calcification of the arterial vasculature without abdominal aortic aneurysm. No pathologically enlarged lymph nodes. Reproductive: Prostate is enlarged. Other: No free fluid.  Mesenteries and peritoneum are unremarkable. Musculoskeletal: No worrisome lytic or sclerotic lesions. Degenerative changes are seen at symphysis pubis, sacroiliac joints and spine. IMPRESSION: 1. No findings to explain the patient's pain. 2. Aortic atherosclerosis and coronary artery calcification. 3. Enlarged prostate. Electronically Signed   By: Leanna Battles M.D.   On: 05/16/2016 13:05   US Abdomen Limited Ruq  Result Date: 05/16/2016 CLINICAL DATA:  RIGHT upper quadrant pain radiating to back for 2 days. History of hypertension, hyperlipidemia and diabetes. EXAM: US ABDOMEN LIMITED - RIGHT UPPER QUADRANT COMPARISON:  None. FINDINGS: Gallbladder: No gallstones or wall thickening visualized. No sonographic Murphy sign noted by sonographer. Common bile duct: Diameter: 5 mm Liver: No focal lesion identified. Within normal limits in parenchymal echogenicity. Hepatopetal portal vein. IMPRESSION: Negative RIGHT upper quadrant ultrasound. Electronically Signed   By: Awilda Metro M.D.   On: 05/16/2016 05:18      Management plans discussed with the patient and he is in agreement. Stable for discharge home  Patient should follow up with cardiology next week for stress test  CODE STATUS:     Code Status Orders        Start     Ordered   05/16/16 0755  Full code  Continuous     05/16/16 0754    Code Status History    Date Active Date Inactive Code Status Order ID Comments User Context   This patient has a current code status but no historical code status.      TOTAL TIME TAKING CARE OF THIS PATIENT: 35 minutes.    Note: This dictation was prepared with Dragon dictation along with  smaller phrase technology. Any transcriptional errors that result from this process are unintentional.  Diamone Whistler M.D on 05/17/2016 at 10:07 AM  Between 7am to 6pm - Pager - [phone] After 6pm go to [email or web address] - Social research officer, government  [facility]  Office  [phone]  CC: Primary care physician; Presley Raddle, MD

## 2016-05-17 NOTE — Progress Notes (Signed)
Discharge: Pt d/c from room via wheelchair, Family member with the pt. Discharge instructions given to the patient and family members.  No questions from pt, reintegrated to the pt to call or go to the ED for chest discomfort. Pt dressed in street clothes and left with discharge papers and prescriptions in hand. IV d/ced, tele removed and no complaints of pain or discomfort. 

## 2016-05-18 ENCOUNTER — Other Ambulatory Visit: Payer: Self-pay

## 2016-05-18 ENCOUNTER — Telehealth: Payer: Self-pay

## 2016-05-18 DIAGNOSIS — R079 Chest pain, unspecified: Secondary | ICD-10-CM

## 2016-05-18 NOTE — Telephone Encounter (Signed)
-----   Message from Ellsworth Lennox, [place] sent at 05/18/2016  8:11 AM EDT ----- Regarding: Follow-Up Appointment and NST [facility],   Dr. Herbie Baltimore saw this patient over the weekend and called me about him this AM as he was discharged yesterday. Can we get him scheduled for an outpatient Treadmill Myoview and follow-up afterwards? Feel free to forward this to whoever typically works with Herbie Baltimore or [employer]. I didn't know who typically schedules the nucs!  Thanks,  [place]

## 2016-05-18 NOTE — Telephone Encounter (Addendum)
He needs a hospital f/u w/ Herbie Baltimore.  I'm not sure if he speaks [place].

## 2016-05-18 NOTE — Telephone Encounter (Signed)
Attempted to contact pt.  Left message on pt's vm, though his vm message is in [place].

## 2016-05-19 ENCOUNTER — Telehealth: Payer: Self-pay | Admitting: Cardiology

## 2016-05-19 NOTE — Telephone Encounter (Signed)
-----   Message from Sondra Barges, PA-C sent at 05/18/2016  5:26 PM EDT ----- Regarding: FW: Needs OP ST & Follow-up   ----- Message ----- From: Marykay Lex, MD Sent: 05/17/2016   9:58 AM To: Sondra Barges, PA-C, Shon Baton, RN Subject: Needs OP ST & Follow-up                        Pt was admitted over the weekend - CP - r/o MI. Needs OP ST & f/u appt.  Thnx.  DH

## 2016-05-19 NOTE — Telephone Encounter (Signed)
lmov for patient to call back and schedule appointment. °

## 2016-05-26 DIAGNOSIS — R202 Paresthesia of skin: Secondary | ICD-10-CM | POA: Insufficient documentation

## 2016-05-26 DIAGNOSIS — R400 Somnolence: Secondary | ICD-10-CM | POA: Insufficient documentation

## 2016-05-26 DIAGNOSIS — R200 Anesthesia of skin: Secondary | ICD-10-CM | POA: Insufficient documentation

## 2016-05-26 DIAGNOSIS — E782 Mixed hyperlipidemia: Secondary | ICD-10-CM | POA: Insufficient documentation

## 2016-06-04 NOTE — Telephone Encounter (Signed)
lmov to call back and schedule fu

## 2016-06-05 NOTE — Telephone Encounter (Signed)
Patient had stress test on Wednesday 06/03/16 and he has an appointment next week to follow up with his cardiologist Dr. Lady GaryFath. Patients daughter states that is his regular cardiologist. They had no further questions at this time.

## 2016-06-08 NOTE — Telephone Encounter (Signed)
I'm glad that we were consulted for a Dr. Lady GaryFath patient.   I did not see any documentation of this & the patient/daughter did not mention it.   Glad to see that he has f/u.    Bryan Lemmaavid Darneshia Demary, MD

## 2016-06-25 DIAGNOSIS — F0390 Unspecified dementia without behavioral disturbance: Secondary | ICD-10-CM | POA: Insufficient documentation

## 2016-06-25 DIAGNOSIS — F03A0 Unspecified dementia, mild, without behavioral disturbance, psychotic disturbance, mood disturbance, and anxiety: Secondary | ICD-10-CM | POA: Insufficient documentation

## 2016-06-26 ENCOUNTER — Other Ambulatory Visit: Payer: Self-pay | Admitting: Neurology

## 2016-06-26 DIAGNOSIS — F03A0 Unspecified dementia, mild, without behavioral disturbance, psychotic disturbance, mood disturbance, and anxiety: Secondary | ICD-10-CM

## 2016-06-26 DIAGNOSIS — F0390 Unspecified dementia without behavioral disturbance: Secondary | ICD-10-CM

## 2016-07-03 ENCOUNTER — Other Ambulatory Visit: Payer: Self-pay | Admitting: Neurology

## 2016-07-03 DIAGNOSIS — F0390 Unspecified dementia without behavioral disturbance: Secondary | ICD-10-CM

## 2018-04-08 ENCOUNTER — Other Ambulatory Visit: Payer: Self-pay | Admitting: Family Medicine

## 2018-04-08 DIAGNOSIS — Z13820 Encounter for screening for osteoporosis: Secondary | ICD-10-CM

## 2018-04-18 ENCOUNTER — Ambulatory Visit
Admission: RE | Admit: 2018-04-18 | Discharge: 2018-04-18 | Disposition: A | Discharge status: Home / Self Care | Payer: Medicare Other | Source: Ambulatory Visit | Attending: Family Medicine | Admitting: Family Medicine

## 2018-04-18 ENCOUNTER — Encounter (INDEPENDENT_AMBULATORY_CARE_PROVIDER_SITE_OTHER): Payer: Self-pay

## 2018-04-18 DIAGNOSIS — Z7982 Long term (current) use of aspirin: Secondary | ICD-10-CM | POA: Insufficient documentation

## 2018-04-18 DIAGNOSIS — E119 Type 2 diabetes mellitus without complications: Secondary | ICD-10-CM | POA: Diagnosis not present

## 2018-04-18 DIAGNOSIS — M1990 Unspecified osteoarthritis, unspecified site: Secondary | ICD-10-CM | POA: Diagnosis not present

## 2018-04-18 DIAGNOSIS — Z7984 Long term (current) use of oral hypoglycemic drugs: Secondary | ICD-10-CM | POA: Insufficient documentation

## 2018-04-18 DIAGNOSIS — Z13820 Encounter for screening for osteoporosis: Secondary | ICD-10-CM | POA: Insufficient documentation

## 2018-05-03 ENCOUNTER — Ambulatory Visit: Payer: Medicare Other | Admitting: Nurse Practitioner

## 2018-06-07 ENCOUNTER — Ambulatory Visit: Payer: Medicare Other | Admitting: Nurse Practitioner

## 2018-06-10 DIAGNOSIS — R413 Other amnesia: Secondary | ICD-10-CM | POA: Insufficient documentation

## 2018-06-20 ENCOUNTER — Other Ambulatory Visit: Payer: Self-pay | Admitting: Family Medicine

## 2018-06-20 DIAGNOSIS — I739 Peripheral vascular disease, unspecified: Secondary | ICD-10-CM

## 2018-06-22 ENCOUNTER — Ambulatory Visit
Admission: RE | Admit: 2018-06-22 | Discharge: 2018-06-22 | Disposition: A | Discharge status: Home / Self Care | Payer: Medicare Other | Source: Ambulatory Visit | Attending: Family Medicine | Admitting: Family Medicine

## 2018-06-22 DIAGNOSIS — I6529 Occlusion and stenosis of unspecified carotid artery: Secondary | ICD-10-CM | POA: Diagnosis not present

## 2018-06-22 DIAGNOSIS — I739 Peripheral vascular disease, unspecified: Secondary | ICD-10-CM | POA: Insufficient documentation

## 2019-06-13 ENCOUNTER — Ambulatory Visit: Payer: Self-pay | Admitting: Urology

## 2019-06-23 ENCOUNTER — Ambulatory Visit: Payer: Medicare Other | Admitting: Urology

## 2019-06-23 ENCOUNTER — Other Ambulatory Visit
Admission: RE | Admit: 2019-06-23 | Discharge: 2019-06-23 | Disposition: A | Discharge status: Home / Self Care | Payer: Medicare Other | Attending: Urology | Admitting: Urology

## 2019-06-23 ENCOUNTER — Encounter: Payer: Self-pay | Admitting: Urology

## 2019-06-23 ENCOUNTER — Other Ambulatory Visit: Payer: Self-pay

## 2019-06-23 VITALS — BP 179/98 | HR 85 | Ht 65.0 in | Wt 232.0 lb

## 2019-06-23 DIAGNOSIS — R972 Elevated prostate specific antigen [PSA]: Secondary | ICD-10-CM

## 2019-06-23 DIAGNOSIS — N400 Enlarged prostate without lower urinary tract symptoms: Secondary | ICD-10-CM

## 2019-06-23 DIAGNOSIS — N401 Enlarged prostate with lower urinary tract symptoms: Secondary | ICD-10-CM

## 2019-06-23 DIAGNOSIS — R339 Retention of urine, unspecified: Secondary | ICD-10-CM | POA: Diagnosis not present

## 2019-06-23 DIAGNOSIS — N138 Other obstructive and reflux uropathy: Secondary | ICD-10-CM | POA: Diagnosis not present

## 2019-06-23 LAB — URINALYSIS, COMPLETE (UACMP) WITH MICROSCOPIC
Bacteria, UA: NONE SEEN
Bilirubin Urine: NEGATIVE
Glucose, UA: NEGATIVE mg/dL
Ketones, ur: NEGATIVE mg/dL
Leukocytes,Ua: NEGATIVE
Nitrite: NEGATIVE
Protein, ur: 100 mg/dL — AB
Specific Gravity, Urine: 1.025 (ref 1.005–1.030)
Squamous Epithelial / HPF: NONE SEEN (ref 0–5)
pH: 7 (ref 5.0–8.0)

## 2019-06-23 LAB — BLADDER SCAN AMB NON-IMAGING

## 2019-06-23 MED ORDER — FINASTERIDE 5 MG PO TABS: 5.0000 mg | ORAL_TABLET | Freq: Every day | ORAL | 11 refills | Status: DC

## 2019-06-23 NOTE — Progress Notes (Signed)
Interpreter ID [phone]

## 2019-06-23 NOTE — Progress Notes (Signed)
06/23/2019 3:46 PM   Tristan Larson 05-Dec-1945 [id number]  Referring provider: Preston Fleeting, MD [address] [place],  [place] [address]  Chief Complaint  Patient presents with  . Benign Prostatic Hypertrophy    New Patient    HPI: 73 year old male with multiple medical comorbidities who presents today for further evaluation of elevated PSA and urinary symptoms.  He is accompanied today by his daughter due to the history of mild dementia.  Spanish interpreter is being used as well.  Notably, his PSA has been rising over the past several years.  PSA trend as below.  Patient his PSA was repeated a week after his initial rise was appreciated and is now up to 7.2.  He had a urine culture performed at the same time which showed less than 10,000 colonies of mixed flora.  There is no associated urinalysis.  He does have fairly significant baseline urinary symptoms.  He is letter urgency frequency gets up often at night to void.  IPSS as below.  He also feels like he is emptying his bladder completely.  He has been taking Flomax for a number of years without much improvement.  PVR is elevated today, Larson below.  No UTIs, hematuria or dysuria.  No family history of prostate cancer.  No weight loss or bone pain.   PSA trend: 7.2  04/2019 6.7 04/2019 3.4 01/2012 2.5 02/2011   IPSS    Row Name 06/23/19 1100         International Prostate Symptom Score   How often have you had the sensation of not emptying your bladder?  Less than half the time     How often have you had to urinate less than every two hours?  Less than half the time     How often have you found you stopped and started again several times when you urinated?  Not at All     How often have you found it difficult to postpone urination?  More than half the time     How often have you had a weak urinary stream?  About half the time     How often have you had to strain to start urination?  Not at All      How many times did you typically get up at night to urinate?  2 Times     Total IPSS Score  13       Quality of Life due to urinary symptoms   If you were to spend the rest of your life with your urinary condition just the way it is now how would you feel about that?  Mostly Disatisfied        Score:  1-7 Mild 8-19 Moderate 20-35 Severe   PMH: Past Medical History:  Diagnosis Date  . Diabetes mellitus without complication (HCC)   . Hypercholesteremia   . Hypertension     Surgical History: Past Surgical History:  Procedure Laterality Date  . HERNIA REPAIR      Home Medications:  Allergies as of 06/23/2019   No Known Allergies     Medication List       Accurate as of June 23, 2019 11:59 PM. If you have any questions, ask your nurse or doctor.        amLODipine 10 MG tablet Commonly known as: NORVASC   aspirin EC 81 MG tablet Take 81 mg by mouth daily.   atenolol 25 MG tablet Commonly known as: TENORMIN  Take 1 tablet (25 mg total) by mouth daily.   atorvastatin 40 MG tablet Commonly known as: LIPITOR Take 40 mg by mouth daily.   CALCIUM 1200 PO Take 1,200 mg by mouth daily.   donepezil 10 MG tablet Commonly known as: ARICEPT Take 10 mg by mouth daily.   finasteride 5 MG tablet Commonly known as: PROSCAR Take 1 tablet (5 mg total) by mouth daily. Started by: Hollice Espy, MD   metFORMIN 500 MG tablet Commonly known as: GLUCOPHAGE Take 500 mg by mouth daily.   multivitamin with minerals tablet Take 1 tablet by mouth daily.   naproxen sodium 220 MG tablet Commonly known as: Aleve Take 1 tablet (220 mg total) by mouth 2 (two) times daily with a meal.   quinapril 40 MG tablet Commonly known as: ACCUPRIL Take 40 mg by mouth daily.   tamsulosin 0.4 MG Caps capsule Commonly known as: FLOMAX   Vitamin D-3 25 MCG (1000 UT) Caps Take 1,000 Units by mouth daily.       Allergies: No Known Allergies  Family History: Family History   Problem Relation Age of Onset  . Hypertension Mother   . Diabetes Mellitus II Mother     Social History:  reports that he has quit smoking. He has never used smokeless tobacco. He reports that he does not drink alcohol or use drugs.  ROS: UROLOGY Frequent Urination?: Yes Hard to postpone urination?: Yes Burning/pain with urination?: No Get up at night to urinate?: Yes Leakage of urine?: Yes Urine stream starts and stops?: No Trouble starting stream?: No Do you have to strain to urinate?: No Blood in urine?: No Urinary tract infection?: No Sexually transmitted disease?: No Injury to kidneys or bladder?: No Painful intercourse?: No Weak stream?: Yes Erection problems?: Yes Penile pain?: No  Gastrointestinal Nausea?: No Vomiting?: No Indigestion/heartburn?: No Diarrhea?: No Constipation?: No  Constitutional Fever: No Night sweats?: No Weight loss?: No Fatigue?: No  Skin Skin rash/lesions?: No Itching?: No  Eyes Blurred vision?: No Double vision?: No  Ears/Nose/Throat Sore throat?: No Sinus problems?: No  Hematologic/Lymphatic Swollen glands?: No Easy bruising?: No  Cardiovascular Leg swelling?: No Chest pain?: No  Respiratory Cough?: No Shortness of breath?: No  Endocrine Excessive thirst?: No  Musculoskeletal Back pain?: No Joint pain?: No  Neurological Headaches?: No Dizziness?: No  Psychologic Depression?: No Anxiety?: No  Physical Exam: BP (!) 179/98   Pulse 85   Ht 5\' 5"  (1.651 m)   Wt 232 lb (105.2 kg)   BMI 38.61 kg/m   Constitutional:  Alert and oriented, No acute distress. HEENT: [place] AT, moist mucus membranes.  Trachea midline, no masses. Cardiovascular: No clubbing, cyanosis, or edema. Respiratory: Normal respiratory effort, no increased work of breathing. GI: Abdomen is soft, nontender, nondistended, no abdominal masses GU: Normal sphincter tone.  Small external hemorrhoids appreciated.  40 cc prostate, nontender, no  nodules.. Skin: No rashes, bruises or suspicious lesions. Neurologic: Grossly intact, no focal deficits, moving all 4 extremities. Psychiatric: Normal mood and affect.  Laboratory Data: Lab Results  Component Value Date   WBC 6.6 05/16/2016   HGB 16.9 05/16/2016   HCT 49.0 05/16/2016   MCV 88.4 05/16/2016   PLT 217 05/16/2016    Lab Results  Component Value Date   CREATININE 1.69 (H) 05/17/2016    Urinalysis Component     Latest Ref Rng & Units 06/23/2019  Color, Urine     YELLOW YELLOW  Appearance     CLEAR CLEAR  Specific Gravity, Urine     1.005 - 1.030 1.025  pH     5.0 - 8.0 7.0  Glucose, UA     NEGATIVE mg/dL NEGATIVE  Hgb urine dipstick     NEGATIVE SMALL (A)  Bilirubin Urine     NEGATIVE NEGATIVE  Ketones, ur     NEGATIVE mg/dL NEGATIVE  Protein     NEGATIVE mg/dL 098100 (A)  Nitrite     NEGATIVE NEGATIVE  Leukocytes,Ua     NEGATIVE NEGATIVE  Squamous Epithelial / LPF     0 - 5 NONE SEEN  WBC, UA     0 - 5 WBC/hpf 0-5  RBC / HPF     0 - 5 RBC/hpf 0-5  Bacteria, UA     NONE SEEN NONE SEEN    Pertinent Imaging: Results for orders placed or performed in visit on 06/23/19  BLADDER SCAN AMB NON-IMAGING  Result Value Ref Range   Scan Result 261ml     Assessment & Plan:    1. Benign prostatic hyperplasia with urinary obstruction Poorly controlled urinary symptoms both with irritative and obstructive urinary symptoms UA today unremarkable, no evidence of UTI is contributing factor Incomplete bladder emptying as above We discussed going ahead and adding finasteride to his regimen with dual intention both to improve his urinary symptoms as well as assess for downward trending PSA which would be anticipated with initiation of this medication, if not more concern for malignancy He is agreeable this plan, prescription sent to pharmacy PSA in 3 months  - BLADDER SCAN AMB NON-IMAGING - MR PROSTATE W WO CONTRAST; Future - Urinalysis, Complete w  Microscopic; Future  2. Elevated PSA Elevated rising PSA is somewhat concerning That being said, he is emptying his bladder completely and has significant poorly controlled urinary symptoms which may be contributing factor We discussed the option of prostate biopsy versus prostate MRI Risk and benefits of each were reviewed Given his medical comorbidities and concern for infection, they like to avoid biopsy if at all possible There March did an MRI at this point time and understands if this shows a lesion, he will need a biopsy versus fusion biopsy down the road We will plan to go ahead and proceed with MR-we will call with results if a significant pathology is identified otherwise will discuss this further at his follow-up appointment - MR PROSTATE W WO CONTRAST; Future - PSA; Future  3. Incomplete bladder emptying Elevated PVR to 261 today Discussed the sequela of incomplete bladder emptying including infections, bladder stones, infections amongst others No sequela at this point Add finasteride as above Follow PVR closely, repeat next visit   Return in about 3 months (around 09/23/2019) for PSA/ IPSS/ PVR. and f/u prostate MRI  Vanna ScotlandAshley Abby Stines, MD  [facility] [address], [address] [place], [place] [address] [phone]

## 2019-08-25 ENCOUNTER — Other Ambulatory Visit: Payer: Self-pay | Admitting: *Deleted

## 2019-08-25 MED ORDER — FINASTERIDE 5 MG PO TABS: 5.0000 mg | ORAL_TABLET | Freq: Every day | ORAL | 3 refills | Status: DC

## 2019-08-28 ENCOUNTER — Telehealth: Payer: Self-pay | Admitting: Urology

## 2019-08-28 NOTE — Telephone Encounter (Signed)
Pt's daughter called states that the pt needs a PA for Finasteride. The pharmacy is [facility].

## 2019-08-28 NOTE — Telephone Encounter (Signed)
Contacted pharmacy-verified Rx sent by fax

## 2019-09-20 ENCOUNTER — Ambulatory Visit
Admission: RE | Admit: 2019-09-20 | Discharge: 2019-09-20 | Disposition: A | Discharge status: Home / Self Care | Payer: Medicare Other | Source: Ambulatory Visit | Attending: Urology | Admitting: Urology

## 2019-09-20 ENCOUNTER — Other Ambulatory Visit: Payer: Self-pay

## 2019-09-20 DIAGNOSIS — N401 Enlarged prostate with lower urinary tract symptoms: Secondary | ICD-10-CM | POA: Diagnosis present

## 2019-09-20 DIAGNOSIS — R972 Elevated prostate specific antigen [PSA]: Secondary | ICD-10-CM | POA: Insufficient documentation

## 2019-09-20 DIAGNOSIS — N138 Other obstructive and reflux uropathy: Secondary | ICD-10-CM | POA: Insufficient documentation

## 2019-09-20 LAB — POCT I-STAT CREATININE: Creatinine, Ser: 1.2 mg/dL (ref 0.61–1.24)

## 2019-09-20 MED ORDER — GADOBUTROL 1 MMOL/ML IV SOLN
10.0000 mL | Freq: Once | INTRAVENOUS | Status: AC | PRN
  Administered 2019-09-20: 11:00:00 10 mL via INTRAVENOUS

## 2019-09-28 ENCOUNTER — Other Ambulatory Visit
Admission: RE | Admit: 2019-09-28 | Discharge: 2019-09-28 | Disposition: A | Discharge status: Home / Self Care | Payer: Medicare Other | Attending: Urology | Admitting: Urology

## 2019-09-28 DIAGNOSIS — R972 Elevated prostate specific antigen [PSA]: Secondary | ICD-10-CM | POA: Insufficient documentation

## 2019-09-28 LAB — PSA: Prostatic Specific Antigen: 2.6 ng/mL (ref 0.00–4.00)

## 2019-09-29 ENCOUNTER — Other Ambulatory Visit: Payer: Self-pay

## 2019-09-29 ENCOUNTER — Ambulatory Visit: Payer: Medicare Other | Admitting: Urology

## 2019-09-29 ENCOUNTER — Encounter: Payer: Self-pay | Admitting: Urology

## 2019-09-29 VITALS — BP 134/65 | HR 80 | Ht 63.0 in | Wt 235.0 lb

## 2019-09-29 DIAGNOSIS — N138 Other obstructive and reflux uropathy: Secondary | ICD-10-CM | POA: Diagnosis not present

## 2019-09-29 DIAGNOSIS — R972 Elevated prostate specific antigen [PSA]: Secondary | ICD-10-CM | POA: Diagnosis not present

## 2019-09-29 DIAGNOSIS — N401 Enlarged prostate with lower urinary tract symptoms: Secondary | ICD-10-CM

## 2019-09-29 NOTE — Progress Notes (Signed)
09/29/2019 11:55 AM   Tristan Larson 03/09/1946 [id number]  Referring provider: Theotis Burrow, MD [address] [place],  [place] [address]  Chief Complaint  Patient presents with  . Benign Prostatic Hypertrophy    39mo    HPI: 73 year old male with BPH/elevated PSA who returns today for 48-month follow-up.  At last visit, he been noted to have a rising PSA over the last several years.  PSA trend as above.  In addition to the above, he had fairly poorly controlled urinary symptoms.  He been taking Flomax for many years and was started on finasteride the last appointment.  Today, his PSA has trended back down nicely to 2.60 as of yesterday with the initiation of finasteride.  In addition to above, he underwent prostate MRI in light of rising PSA which indicated approximately 79 g prostate with no suspicious lesions.  Findings were consistent with prostamegaly/enlarged transition zone with nodular extension of the base consistent with BPH, PI-RADS 1 lesion.   PSA trend: 2.6 12/20 7.2  04/2019 6.7 04/2019 3.4 01/2012 2.5 02/2011      PMH: Past Medical History:  Diagnosis Date  . Diabetes mellitus without complication ([place])   . Hypercholesteremia   . Hypertension     Surgical History: Past Surgical History:  Procedure Laterality Date  . HERNIA REPAIR      Home Medications:  Allergies as of 09/29/2019   No Known Allergies     Medication List       Accurate as of September 29, 2019 11:59 PM. If you have any questions, ask your nurse or doctor.        amLODipine 10 MG tablet Commonly known as: NORVASC   aspirin EC 81 MG tablet Take 81 mg by mouth daily.   atenolol 25 MG tablet Commonly known as: TENORMIN Take 1 tablet (25 mg total) by mouth daily.   atorvastatin 40 MG tablet Commonly known as: LIPITOR Take 40 mg by mouth daily.   CALCIUM 1200 PO Take 1,200 mg by mouth daily.   donepezil 10 MG tablet Commonly known as: ARICEPT Take 10  mg by mouth daily.   finasteride 5 MG tablet Commonly known as: PROSCAR Take 1 tablet (5 mg total) by mouth daily.   meloxicam 15 MG tablet Commonly known as: MOBIC Take 15 mg by mouth daily.   metFORMIN 500 MG tablet Commonly known as: GLUCOPHAGE Take 500 mg by mouth daily.   multivitamin with minerals tablet Take 1 tablet by mouth daily.   naproxen sodium 220 MG tablet Commonly known as: Aleve Take 1 tablet (220 mg total) by mouth 2 (two) times daily with a meal.   quinapril 40 MG tablet Commonly known as: ACCUPRIL Take 40 mg by mouth daily.   tamsulosin 0.4 MG Caps capsule Commonly known as: FLOMAX   Vitamin D-3 25 MCG (1000 UT) Caps Take 1,000 Units by mouth daily.       Allergies: No Known Allergies  Family History: Family History  Problem Relation Age of Onset  . Hypertension Mother   . Diabetes Mellitus II Mother     Social History:  reports that he has quit smoking. He has never used smokeless tobacco. He reports that he does not drink alcohol and does not use drugs.  ROS: UROLOGY Frequent Urination?: No Hard to postpone urination?: Yes Burning/pain with urination?: No Get up at night to urinate?: Yes Leakage of urine?: Yes Urine stream starts and stops?: Yes Trouble starting stream?: No  Do you have to strain to urinate?: No Blood in urine?: No Urinary tract infection?: No Sexually transmitted disease?: No Injury to kidneys or bladder?: No Painful intercourse?: No Weak stream?: Yes Erection problems?: Yes Penile pain?: No  Gastrointestinal Nausea?: No Vomiting?: No Indigestion/heartburn?: No Diarrhea?: No Constipation?: No  Constitutional Fever: No Night sweats?: No Weight loss?: No Fatigue?: No  Skin Skin rash/lesions?: No Itching?: No  Eyes Blurred vision?: No Double vision?: No  Ears/Nose/Throat Sore throat?: No Sinus problems?: No  Hematologic/Lymphatic Swollen glands?: No Easy bruising?: No  Cardiovascular Leg  swelling?: Yes Chest pain?: No  Respiratory Cough?: No Shortness of breath?: No  Endocrine Excessive thirst?: No  Musculoskeletal Back pain?: No Joint pain?: No  Neurological Headaches?: No Dizziness?: No  Psychologic Depression?: No Anxiety?: No  Physical Exam: BP 134/65   Pulse 80   Ht 5\' 3"  (1.6 m)   Wt 235 lb (106.6 kg)   BMI 41.63 kg/m   Constitutional:  Alert and oriented, No acute distress. HEENT: [place] AT, moist mucus membranes.  Trachea midline, no masses. Cardiovascular: No clubbing, cyanosis, or edema. Respiratory: Normal respiratory effort, no increased work of breathing. Skin: No rashes, bruises or suspicious lesions. Neurologic: Grossly intact, no focal deficits, moving all 4 extremities. Psychiatric: Normal mood and affect.  Laboratory Data: Lab Results  Component Value Date   WBC 6.6 05/16/2016   HGB 16.9 05/16/2016   HCT 49.0 05/16/2016   MCV 88.4 05/16/2016   PLT 217 05/16/2016    Lab Results  Component Value Date   CREATININE 1.20 09/20/2019   Results for orders placed or performed during the hospital encounter of 09/28/19  PSA  Result Value Ref Range   Prostatic Specific Antigen 2.60 0.00 - 4.00 ng/mL     Assessment & Plan:    1. Elevated PSA Appropriately declined with initiation of finasteride MRI was reassuring Will repeat PSA in 1 year with DRE  2. Benign prostatic hyperplasia with urinary obstruction Symptoms improving on finasteride / flomax   Return in about 6 months (around 03/29/2020) for IPSS/ PVR.  05/29/2020, MD  [facility] [address] [place], [place] [phone]

## 2019-10-03 ENCOUNTER — Telehealth: Payer: Self-pay | Admitting: Urology

## 2019-10-03 MED ORDER — SILDENAFIL CITRATE 20 MG PO TABS: 20.0000 mg | ORAL_TABLET | ORAL | 11 refills | Status: DC | PRN

## 2019-10-03 NOTE — Telephone Encounter (Signed)
Patient's daughter informed-requested sent to [facility] [place]-[facility]-verbalized understanding.

## 2019-10-03 NOTE — Telephone Encounter (Signed)
Pt's daughter called and states that he was supposed to have Sildenafil sent to [facility] in [place] and it has not been sent yet. She would like a call back to verify it was sent.

## 2019-12-22 ENCOUNTER — Telehealth: Payer: Self-pay | Admitting: Urology

## 2019-12-22 NOTE — Telephone Encounter (Signed)
Pt.'s daughter said this medication needs prior auth and a transfer. per Pharmacy. Prior Auth number she was given [phone]. Fax-[phone]. Prescription Transfer Number [phone] fax-[phone]. She would like a call once this is taken care of.

## 2019-12-28 NOTE — Telephone Encounter (Signed)
Spoke with daughter and called [facility] to initiate PA. Key # [id number] Aware could take up to 72 hours.

## 2019-12-29 ENCOUNTER — Other Ambulatory Visit: Payer: Self-pay | Admitting: *Deleted

## 2019-12-29 MED ORDER — SILDENAFIL CITRATE 20 MG PO TABS: 20.0000 mg | ORAL_TABLET | ORAL | 11 refills | Status: DC | PRN

## 2019-12-29 NOTE — Progress Notes (Signed)
Spoke with patient's daughter aware PA denied.

## 2020-01-05 NOTE — Telephone Encounter (Signed)
PA denied-not covered by [facility]-patient's daughter aware.

## 2020-02-27 ENCOUNTER — Other Ambulatory Visit: Payer: Self-pay | Admitting: *Deleted

## 2020-02-27 MED ORDER — FINASTERIDE 5 MG PO TABS: 5.0000 mg | ORAL_TABLET | Freq: Every day | ORAL | 3 refills | Status: DC

## 2020-02-27 MED ORDER — TAMSULOSIN HCL 0.4 MG PO CAPS: 0.4000 mg | ORAL_CAPSULE | Freq: Every day | ORAL | 3 refills | Status: DC

## 2020-02-27 MED ORDER — SILDENAFIL CITRATE 20 MG PO TABS: 20.0000 mg | ORAL_TABLET | ORAL | 11 refills | Status: DC | PRN

## 2020-03-13 ENCOUNTER — Other Ambulatory Visit: Payer: Self-pay

## 2020-03-13 ENCOUNTER — Telehealth: Payer: Self-pay | Admitting: *Deleted

## 2020-03-13 MED ORDER — SILDENAFIL CITRATE 20 MG PO TABS: 20.0000 mg | ORAL_TABLET | ORAL | 6 refills | Status: DC | PRN

## 2020-03-13 NOTE — Telephone Encounter (Signed)
Left VM-Received PA for Sildenafil from [facility] denied from 01/05/20. Explained insurance will not cover medication-will have to pay out of pocket. Can use [facility] as explained previously.

## 2020-03-13 NOTE — Telephone Encounter (Signed)
Spoke with patient's daughter and notified her insurance does not cover Sildenafil. Script was sent to [facility] with goodrx coupon texted to daughter

## 2020-03-27 ENCOUNTER — Ambulatory Visit: Payer: Medicare Other | Admitting: Urology

## 2020-03-29 ENCOUNTER — Ambulatory Visit: Payer: Medicare Other | Admitting: Urology

## 2020-07-23 NOTE — Progress Notes (Signed)
07/24/2020 4:08 PM   Tristan Larson 07/17/1946 [id number]  Referring provider: Preston Fleeting, MD [address] [place],  [place] [address] Chief Complaint  Patient presents with   Groin Swelling    HPI: Tristan Larson is a 74 y.o. male who presents today for management swollen testicles. Patient has a interpreter and his daughter present today.   Patient had fairly poorly controlled urinary symptoms.  He been taking Flomax for many years and was started on finasteride the last appointment.  PSA had trended back down nicely to 2.60 as of yesterday with the initiation of finasteride.  In addition to above, he underwent prostate MRI in light of rising PSA which indicated approximately 79 g prostate with no suspicious lesions.  Findings were consistent with prostamegaly/enlarged transition zone with nodular extension of the base consistent with BPH, PI-RADS 1 lesion.  He feels like his left testicle has been increasing in size x 6 months. Denies pain or discomfort. He feels like the testicle is bulking and in the way.  Size of left greater than right at least 3 to 4 months.  It is bothersome to him in terms of difficulty fitting his pants, as well as heaviness.  PSA trend: 2.6 12/20 7.2 04/2019 6.7 04/2019 3.4 01/2012 2.5 02/2011   PMH: Past Medical History:  Diagnosis Date   Diabetes mellitus without complication ([facility])    Hypercholesteremia    Hypertension     Surgical History: Past Surgical History:  Procedure Laterality Date   HERNIA REPAIR      Home Medications:  Allergies as of 07/24/2020   No Known Allergies     Medication List       Accurate as of July 24, 2020  4:08 PM. If you have any questions, ask your nurse or doctor.        amLODipine 10 MG tablet Commonly known as: NORVASC   aspirin EC 81 MG tablet Take 81 mg by mouth daily.   atenolol 25 MG tablet Commonly known as: TENORMIN Take 1 tablet (25 mg total) by mouth  daily.   atorvastatin 40 MG tablet Commonly known as: LIPITOR Take 40 mg by mouth daily.   CALCIUM 1200 PO Take 1,200 mg by mouth daily.   donepezil 10 MG tablet Commonly known as: ARICEPT Take 10 mg by mouth daily.   finasteride 5 MG tablet Commonly known as: PROSCAR Take 1 tablet (5 mg total) by mouth daily.   furosemide 40 MG tablet Commonly known as: LASIX Take 40 mg by mouth.   meloxicam 15 MG tablet Commonly known as: MOBIC Take 15 mg by mouth as needed.   memantine 5 MG tablet Commonly known as: NAMENDA Take 5 mg by mouth at bedtime.   metFORMIN 500 MG tablet Commonly known as: GLUCOPHAGE Take 500 mg by mouth daily.   multivitamin with minerals tablet Take 1 tablet by mouth daily.   naproxen sodium 220 MG tablet Commonly known as: Aleve Take 1 tablet (220 mg total) by mouth 2 (two) times daily with a meal. What changed:   when to take this  reasons to take this   quinapril 40 MG tablet Commonly known as: ACCUPRIL Take 40 mg by mouth daily.   sildenafil 20 MG tablet Commonly known as: Revatio Take 1 tablet (20 mg total) by mouth as needed. Take 1-5 tabs as needed prior to intercourse   tamsulosin 0.4 MG Caps capsule Commonly known as: FLOMAX Take 1 capsule (0.4 mg total)  by mouth daily.   Vitamin D-3 25 MCG (1000 UT) Caps Take 1,000 Units by mouth daily.       Allergies: No Known Allergies  Family History: Family History  Problem Relation Age of Onset   Hypertension Mother    Diabetes Mellitus II Mother     Social History:  reports that he has quit smoking. He has never used smokeless tobacco. He reports that he does not drink alcohol and does not use drugs.   Physical Exam: BP (!) 176/87    Pulse 72   Constitutional:  Alert and oriented, No acute distress. HEENT: [place] AT, moist mucus membranes.  Trachea midline, no masses. Cardiovascular: No clubbing, cyanosis, or edema. Respiratory: Normal respiratory effort, no increased work of  breathing. GI: Abdomen is soft, nontender, nondistended, no abdominal masses GU: No CVA tenderness. Uncircumcised phallus. Left hydrocele softball size, small right hydrocele.  Testicles nonpalpable due to the aforementioned. Skin: No rashes, bruises or suspicious lesions. Neurologic: Grossly intact, no focal deficits, moving all 4 extremities. Psychiatric: Normal mood and affect.  Assessment & Plan:    1. Elevated PSA MRI was reassuring PSA was 2.3 in 09/2019.  2. BPH with urinary obstruction  On finasteride and Flomax.  3. Left hydrocele Exam today consistent with large left hydrocele, moderately symptomatic Advise patient to wear supportive tight underwear.  Discussed hydrocelectomy versus percutaneous aspiration in the office.  Risk and benefits of each were discussed.  We discussed that aspiration in the clinic has a extremely high recurrence rate and can introduce infections.  Alternatively, consider hydrocelectomy in the operating room.  Discussed risk and benefit the site of this.  Recurrence rate approximately 10% with hydrocelectomy.  Additional risk including hematoma, infection, recurrence, damage surrounding structures amongst others were all discussed. He may be interested in pursuing the aforementioned in the future however at this current time, he is undergoing cardiac evaluation his other medical comorbidities that he is dealing with.  This particular issue takes less priority for both him and his daughter as well as myself. He will hold off for the time being let us know how would like to proceed in the future pending management of his other medical comorbidities. Patient understood and agreed.  Will need cardiology clearance and hold blood thinners prior to procedure.   Follow-up scheduled for BPH visit/PSA monitoring  [facility] [address] [place], [place] [address] [phone]  I, Theador Hawthorne, am acting as a scribe  for Dr. Vanna Scotland.  I have reviewed the above documentation for accuracy and completeness, and I agree with the above.   Vanna Scotland, MD  I spent 30 total minutes on the day of the encounter including pre-visit review of the medical record, face-to-face time with the patient, and post visit ordering of labs/imaging/tests.

## 2020-07-24 ENCOUNTER — Ambulatory Visit (INDEPENDENT_AMBULATORY_CARE_PROVIDER_SITE_OTHER): Payer: Medicare Other | Admitting: Urology

## 2020-07-24 ENCOUNTER — Other Ambulatory Visit: Payer: Self-pay

## 2020-07-24 ENCOUNTER — Encounter: Payer: Self-pay | Admitting: Urology

## 2020-07-24 VITALS — BP 176/87 | HR 72

## 2020-07-24 DIAGNOSIS — N432 Other hydrocele: Secondary | ICD-10-CM | POA: Diagnosis not present

## 2020-07-24 DIAGNOSIS — R339 Retention of urine, unspecified: Secondary | ICD-10-CM

## 2020-07-24 DIAGNOSIS — N5089 Other specified disorders of the male genital organs: Secondary | ICD-10-CM

## 2020-07-24 DIAGNOSIS — R972 Elevated prostate specific antigen [PSA]: Secondary | ICD-10-CM

## 2020-07-24 NOTE — Patient Instructions (Signed)
Hydrocelectomy, Adult  A hydrocelectomy is a surgical procedure to remove a collection of fluid (hydrocele) from the scrotum, which is the pouch that holds the testicles. You may need to have this procedure if a hydrocele is causing painful swelling in your scrotum. Tell a health care provider about:  Any allergies you have.  All medicines you are taking, including vitamins, herbs, eye drops, creams, and over-the-counter medicines.  Any problems you or family members have had with anesthetic medicines.  Any blood disorders you have.  Any surgeries you have had.  Any medical conditions you have. What are the risks? Generally, this is a safe procedure. However, problems may occur, including:  Bleeding into the scrotum (scrotal hematoma).  Damage to nearby structures or organs, including to the testicle or the tube that carries sperm out of the testicle (vas deferens).  Infection.  Allergic reactions to medicines. What happens before the procedure? Staying hydrated Follow instructions from your health care provider about hydration, which may include:  Up to 2 hours before the procedure - you may continue to drink clear liquids, such as water, clear fruit juice, black coffee, and plain tea. Eating and drinking restrictions Follow instructions from your health care provider about eating and drinking, which may include:  8 hours before the procedure - stop eating heavy meals or foods, such as meat, fried foods, or fatty foods.  6 hours before the procedure - stop eating light meals or foods, such as toast or cereal.  6 hours before the procedure - stop drinking milk or drinks that contain milk.  2 hours before the procedure - stop drinking clear liquids. Medicines Ask your health care provider about:  Changing or stopping your regular medicines. This is especially important if you are taking diabetes medicines or blood thinners.  Taking medicines such as aspirin and ibuprofen.  These medicines can thin your blood. Do not take these medicines unless your health care provider tells you to take them.  Taking over-the-counter medicines, vitamins, herbs, and supplements. General instructions  Do not use any products that contain nicotine or tobacco for at least 4 weeks before the procedure. These products include cigarettes, e-cigarettes, and chewing tobacco. If you need help quitting, ask your health care provider.  Plan to have someone take you home from the hospital or clinic.  Plan to have a responsible adult care for you for at least 24 hours after you leave the hospital or clinic. This is important.  Ask your health care provider: ? How your surgery site will be marked. ? What steps will be taken to help prevent infection. These may include:  Removing hair at the surgery site.  Washing skin with a germ-killing soap.  Taking antibiotic medicine. What happens during the procedure?  An IV will be inserted into one of your veins.  You will be given one or more of the following: ? A medicine to make you relax (sedative). ? A medicine to make you fall asleep (general anesthetic).  A small incision will be made through the skin of your scrotum.  Your testicle and the hydrocele will be located, and the hydrocele sac will be opened with an incision.  The fluid will be drained from the hydrocele. Part of the hydrocele sac may be removed.  The hydrocele will be closed with stitches that dissolve (absorbable sutures). This prevents fluid from building up again.  If your hydrocele is large, you may have a thin, rubber drain placed to allow fluid to drain  after the procedure.  The incision in your scrotum will be closed with absorbable sutures, skin glue, or adhesives.  A bandage (dressing) will be placed over the incision. The dressing may be held in place with an athletic support strap (scrotal support). The procedure may vary among health care providers and  hospitals. What happens after the procedure?   Your blood pressure, heart rate, breathing rate, and blood oxygen level will be monitored until you leave the hospital or clinic.  You will be given pain medicine as needed.  Your IV will be removed, and your insertion site will be checked for bleeding.  Do not drive for 24 hours if you were given a sedative during your procedure.  You may need to wear a scrotal support. This holds the dressing in place and supports your scrotum. Summary  A hydrocelectomy is a surgical procedure to remove a collection of fluid (hydrocele) from the scrotum, which is the pouch that holds the testicles. You may need to have this procedure if a hydrocele is causing painful swelling in your scrotum.  During the procedure, the hydrocele will be drained and then closed with stitches that dissolve (absorbable sutures). This prevents fluid from building up again.  If your hydrocele is large, you may have a thin, rubber drain placed to allow fluid to drain after the procedure.  You may need to wear a scrotal support after your procedure. This holds the dressing in place and supports your scrotum. This information is not intended to replace advice given to you by your health care provider. Make sure you discuss any questions you have with your health care provider. Document Revised: 03/07/2019 Document Reviewed: 03/07/2019 Elsevier Patient Education  2020 [employer]. Hidrocelectoma en los adultos Hydrocelectomy, Adult  O[place] hidrocelectoma es un procedimiento quirrgico que se realiza para extraer una acumulacin de lquido (hidrocele) del escroto que es la bolsa que contiene los testculos. Es posible que deba someterse a este procedimiento si un hidrocele le causa una hinchazn dolorosa en el escroto. Informe al pediatra acerca de lo siguiente:  Cualquier alergia que tenga.  Todos los [employer] [place], incluidos vitaminas, hierbas, gotas oftlmicas, cremas  y [address] de [address].  Cualquier problema previo que usted o algn miembro de su familia hayan tenido con los anestsicos.  Cualquier trastorno de la sangre que tenga.  Cirugas a las que se haya sometido.  Cualquier afeccin mdica que tenga. Cules son los riesgos? En general, se trata de un procedimiento seguro. No obstante, pueden presentarse complicaciones, entre las que se pueden incluir las siguientes:  Hemorragia en el escroto (hematoma escrotal).  Daos en las estructuras u rganos cercanos, incluido el testculo o el conducto por donde sale el esperma del testculo (conducto deferente).  Infeccin.  Reacciones alrgicas a los medicamentos. Qu ocurre antes del procedimiento? Mantenerse hidratado Siga las instrucciones del mdico acerca de mantenerse hidratado, las cuales pueden incluir lo siguiente:  [employer] horas antes del procedimiento, puede beber lquidos transparentes, como agua, jugos de fruta sin pulpa, caf negro y t solo. Restricciones en las comidas y bebidas Siga las instrucciones del mdico respecto de las restricciones de comidas o bebidas, las cuales pueden incluir lo siguiente:  Ocho horas antes del procedimiento, deje de ingerir comidas o alimentos pesados, como carne, alimentos fritos o alimentos grasos.  Seis horas antes del procedimiento, deje de ingerir comidas o alimentos livianos, como tostadas o cereales.  Seis horas antes del procedimiento, deje de beber [employer] o bebidas que [employer]  leche.  Dos horas antes del procedimiento, deje de beber lquidos transparentes. Medicamentos Consulte al mdico sobre:  Multimedia programmer o suspender los medicamentos que [place] habitualmente. Esto es muy importante si toma medicamentos para la diabetes o anticoagulantes.  Tomar medicamentos como aspirina e ibuprofeno. Estos medicamentos pueden tener un efecto anticoagulante en la [place]. No tome estos medicamentos a menos que el mdico se lo indique.  Tomar  medicamentos de [employer], vitaminas, hierbas y suplementos. Instrucciones generales  No consuma ningn producto que contenga nicotina ni tabaco durante al [employer] las 4 semanas anteriores al procedimiento. Estos productos incluyen cigarrillos, cigarrillos electrnicos y tabaco para Theatre manager. Si necesita ayuda para dejar de fumar, consulte al mdico.  Haga que alguien lo lleve a su casa desde el hospital o la clnica.  Haga que un adulto responsable lo cuide durante al menos 24horas despus de que le den el alta del hospital o de la [place]. Esto es importante.  Pregntele al mdico: ? Cmo se Forensic psychologist de la Leisure centre manager. ? Qu medidas se tomarn para evitar una infeccin. Estas medidas pueden incluir:  Rasurar el vello del lugar de la ciruga.  Lavar la piel con un jabn antisptico.  Recibir antibiticos. Qu ocurre durante el procedimiento?  Le colocarn una va intravenosa en una vena.  Le administrarn uno o ms de los siguientes medicamentos: ? Un medicamento para ayudarlo a relajarse (sedante). ? Un medicamento que lo har dormir (anestesia general).  Le harn una pequea incisin en la piel del escroto.  Ubicarn el testculo y Engineer, petroleum, y abrirn el saco del hidrocele con una incisin.  Drenarn el lquido del hidrocele. Parte del saco de hidrocele puede extirparse.  El hidrocele se cerrar con puntos que se disolvern (suturas reabsorbibles). Esto impide que el lquido vuelva a acumularse.  Si el hidrocele es grande, pueden colocarle un drenaje delgado de goma para permitir que el lquido drene despus del procedimiento.  La incisin en el escroto se cerrar con suturas reabsorbibles, goma para cerrar la piel o [employer].  Se colocar una venda (vendaje) sobre la incisin. El vendaje puede mantenerse en su lugar con un soporte deportivo (soporte escrotal). El procedimiento puede variar segn el mdico y el hospital. [place] Heads ocurre despus del  procedimiento?   Le controlarn la presin arterial, la frecuencia cardaca, la frecuencia respiratoria y Air cabin crew de oxgeno en la sangre hasta que le den el alta del hospital o la clnica.  Le darn medicamentos para el dolor si los necesita.  Se extraer la va intravenosa, y se controlar que no haya sangrado en Immunologist de la insercin.  No conduzca durante 24horas si le administraron un sedante durante el procedimiento.  Es posible que deba usar un soporte escrotal. Esto sostiene el vendaje en su lugar y Insurance account manager. Resumen  [place] hidrocelectoma es un procedimiento quirrgico que se realiza para extraer una acumulacin de lquido (hidrocele) del escroto que es la bolsa que contiene los testculos. Es posible que deba someterse a este procedimiento si un hidrocele le causa una hinchazn dolorosa en el escroto.  Durante el procedimiento, el hidrocele se drenar y luego se cerrar con puntos que se disolvern (suturas reabsorbibles). Esto impide que el lquido vuelva a acumularse.  Si el hidrocele es grande, pueden colocarle un drenaje delgado de goma para permitir que el lquido drene despus del procedimiento.  Es posible que deba usar un soporte escrotal despus del procedimiento. Esto sostiene el vendaje en su lugar y Insurance account manager. Esta informacin  no tiene Theme park manager el consejo del mdico. Asegrese de hacerle al mdico cualquier pregunta que tenga. Document Revised: 04/20/2019 Document Reviewed: 04/20/2019 Elsevier Patient Education  2020 [employer].

## 2020-07-24 NOTE — Progress Notes (Signed)
Interpreter ID #[id number]

## 2020-07-25 LAB — URINALYSIS, COMPLETE
Bilirubin, UA: NEGATIVE
Glucose, UA: NEGATIVE
Ketones, UA: NEGATIVE
Leukocytes,UA: NEGATIVE
Nitrite, UA: NEGATIVE
Specific Gravity, UA: 1.025 (ref 1.005–1.030)
Urobilinogen, Ur: 0.2 mg/dL (ref 0.2–1.0)
pH, UA: 7 (ref 5.0–7.5)

## 2020-07-25 LAB — MICROSCOPIC EXAMINATION: Bacteria, UA: NONE SEEN

## 2021-01-21 ENCOUNTER — Ambulatory Visit: Payer: Self-pay | Admitting: Urology

## 2021-01-28 ENCOUNTER — Ambulatory Visit: Payer: Self-pay | Admitting: Urology

## 2021-01-31 ENCOUNTER — Other Ambulatory Visit: Payer: Self-pay

## 2021-01-31 ENCOUNTER — Ambulatory Visit: Payer: Medicare Other | Admitting: Urology

## 2021-01-31 ENCOUNTER — Other Ambulatory Visit: Payer: Self-pay | Admitting: Urology

## 2021-01-31 ENCOUNTER — Encounter: Payer: Self-pay | Admitting: Urology

## 2021-01-31 VITALS — BP 144/78 | HR 56

## 2021-01-31 DIAGNOSIS — R339 Retention of urine, unspecified: Secondary | ICD-10-CM

## 2021-01-31 DIAGNOSIS — N432 Other hydrocele: Secondary | ICD-10-CM | POA: Diagnosis not present

## 2021-01-31 DIAGNOSIS — N138 Other obstructive and reflux uropathy: Secondary | ICD-10-CM

## 2021-01-31 DIAGNOSIS — N401 Enlarged prostate with lower urinary tract symptoms: Secondary | ICD-10-CM

## 2021-01-31 LAB — BLADDER SCAN AMB NON-IMAGING: Scan Result: 225

## 2021-01-31 NOTE — Progress Notes (Signed)
01/31/2021 10:34 AM   [name] 08-18-1946 [id number]  Referring provider: Preston Fleeting, MD [address] [place],  [place] [address]  Chief Complaint  Patient presents with  . Benign Prostatic Hypertrophy    HPI: 75 year old male with a personal tree of BPH with incomplete bladder emptying, elevated PSA and hydrocele who returns today for routine 40-month follow-up. He is currently on maximal medical therapy with Flomax and finasteride.  PSA had trended back down nicely to 2.60 as of yesterday with the initiation of finasteride.  In addition to above, he underwent prostate MRI in light of rising PSA which indicated approximately79g prostate with no suspicious lesions. Findings were consistent with prostamegaly/enlarged transition zone with nodular extension of the base consistent with BPH, PI-RADS 1 lesion.  Today, he reports that his urinary symptoms are exacerbated by cold weather.  Whenever he is cold, feels that he has to urinate more frequently.  IPSS as below.  He is also worried today about increasing size of his left testicle.  He feels like it is inflamed.  He is a known large left hydrocele.  Bothers increasing.   PSA trend: 2.6 12/20 7.2 04/2019 6.7 04/2019 3.4 01/2012 2.5 02/2011      IPSS    Row Name 01/31/21 1000         International Prostate Symptom Score   How often have you had the sensation of not emptying your bladder? Less than half the time     How often have you had to urinate less than every two hours? Almost always     How often have you found you stopped and started again several times when you urinated? More than half the time     How often have you found it difficult to postpone urination? More than half the time     How often have you had a weak urinary stream? Not at All     How often have you had to strain to start urination? Not at All     How many times did you typically get up at night to urinate? 2 Times      Total IPSS Score 17           Quality of Life due to urinary symptoms   If you were to spend the rest of your life with your urinary condition just the way it is now how would you feel about that? Unhappy          Score:  1-7 Mild 8-19 Moderate 20-35 Severe   PMH: Past Medical History:  Diagnosis Date  . Diabetes mellitus without complication (HCC)   . Hypercholesteremia   . Hypertension     Surgical History: Past Surgical History:  Procedure Laterality Date  . HERNIA REPAIR      Home Medications:  Allergies as of 01/31/2021   No Known Allergies     Medication List       Accurate as of January 31, 2021 10:34 AM. If you have any questions, ask your nurse or doctor.        amLODipine 10 MG tablet Commonly known as: NORVASC   aspirin EC 81 MG tablet Take 81 mg by mouth daily.   atenolol 25 MG tablet Commonly known as: TENORMIN Take 1 tablet (25 mg total) by mouth daily.   atorvastatin 40 MG tablet Commonly known as: LIPITOR Take 40 mg by mouth daily.   CALCIUM 1200 PO Take 1,200 mg by mouth daily.  donepezil 10 MG tablet Commonly known as: ARICEPT Take 10 mg by mouth daily.   finasteride 5 MG tablet Commonly known as: PROSCAR Take 1 tablet (5 mg total) by mouth daily.   furosemide 40 MG tablet Commonly known as: LASIX Take 40 mg by mouth.   meloxicam 15 MG tablet Commonly known as: MOBIC Take 15 mg by mouth as needed.   memantine 5 MG tablet Commonly known as: NAMENDA Take 5 mg by mouth at bedtime.   metFORMIN 500 MG tablet Commonly known as: GLUCOPHAGE Take 500 mg by mouth daily.   multivitamin with minerals tablet Take 1 tablet by mouth daily.   naproxen sodium 220 MG tablet Commonly known as: Aleve Take 1 tablet (220 mg total) by mouth 2 (two) times daily with a meal. What changed:   when to take this  reasons to take this   quinapril 40 MG tablet Commonly known as: ACCUPRIL Take 40 mg by mouth daily.   sildenafil 20 MG  tablet Commonly known as: Revatio Take 1 tablet (20 mg total) by mouth as needed. Take 1-5 tabs as needed prior to intercourse   tamsulosin 0.4 MG Caps capsule Commonly known as: FLOMAX Take 1 capsule (0.4 mg total) by mouth daily.   Vitamin D-3 25 MCG (1000 UT) Caps Take 1,000 Units by mouth daily.       Allergies: No Known Allergies  Family History: Family History  Problem Relation Age of Onset  . Hypertension Mother   . Diabetes Mellitus II Mother     Social History:  reports that he has quit smoking. He has never used smokeless tobacco. He reports that he does not drink alcohol and does not use drugs.   Physical Exam: BP (!) 144/78   Pulse (!) 56   Constitutional:  Alert and oriented, No acute distress.  Accompanied today by his son.  He also has a Engineer, structural via Lawyer available. HEENT: [place] AT, moist mucus membranes.  Trachea midline, no masses. Cardiovascular: No clubbing, cyanosis, or edema. Respiratory: Normal respiratory effort, no increased work of breathing. GI: Abdomen is soft, nontender, nondistended, no abdominal masses GU: Significantly enlarged melon sized left-sided hydrocele.  Right testicle normal.  Phallus somewhat buried. Skin: No rashes, bruises or suspicious lesions. Neurologic: Grossly intact, no focal deficits, moving all 4 extremities. Psychiatric: Normal mood and affect.  Laboratory Data: Lab Results  Component Value Date   WBC 6.6 05/16/2016   HGB 16.9 05/16/2016   HCT 49.0 05/16/2016   MCV 88.4 05/16/2016   PLT 217 05/16/2016    Lab Results  Component Value Date   CREATININE 1.20 09/20/2019    Pertinent Imaging: Results for orders placed or performed in visit on 01/31/21  Bladder Scan (Post Void Residual) in office  Result Value Ref Range   Scan Result 225      Assessment & Plan:    1. Other hydrocele Large left symptomatic hydrocele  Discuss hydrocelectomy again today.  He is now just in pursuing this.  We  discussed risk including risk of bleeding, infection, damage to any structures, about 10% recurrence rate amongst others.  All questions were answered.  Would like him to hold his aspirin 81 mg which he takes for prophylaxis.  All questions answered.  2. Benign prostatic hyperplasia with urinary obstruction Personal history of enlarged prostate and remains symptomatic with incomplete bladder emptying today, PVR 222  We discussed that at this point, he is on maximal medical therapy.  Could consider outlet  procedure such as holep versus TURP versus UroLift.  He is not sure in pursuing this yet.  We will continue to monitor him closely.  Continue Flomax and finasteride for the time being  PSA today  - Bladder Scan (Post Void Residual) in office  3. Incomplete bladder emptying As above   Hillard Goodwine, MD  [facility] [address], [address] [place], [place] [address] [phone]  

## 2021-01-31 NOTE — Patient Instructions (Addendum)
Hydrocelectomy, Adult  A hydrocelectomy is a surgical procedure to remove a collection of fluid (hydrocele) from the scrotum, which is the pouch that holds the testicles. You may need to have this procedure if a hydrocele is causing painful swelling in your scrotum. Tell a health care provider about:  Any allergies you have.  All medicines you are taking, including vitamins, herbs, eye drops, creams, and over-the-counter medicines.  Any problems you or family members have had with anesthetic medicines.  Any blood disorders you have.  Any surgeries you have had.  Any medical conditions you have. What are the risks? Generally, this is a safe procedure. However, problems may occur, including:  Bleeding into the scrotum (scrotal hematoma).  Damage to nearby structures or organs, including to the testicle or the tube that carries sperm out of the testicle (vas deferens).  Infection.  Allergic reactions to medicines. What happens before the procedure? Staying hydrated Follow instructions from your health care provider about hydration, which may include:  Up to 2 hours before the procedure - you may continue to drink clear liquids, such as water, clear fruit juice, black coffee, and plain tea. Eating and drinking restrictions Follow instructions from your health care provider about eating and drinking, which may include:  8 hours before the procedure - stop eating heavy meals or foods, such as meat, fried foods, or fatty foods.  6 hours before the procedure - stop eating light meals or foods, such as toast or cereal.  6 hours before the procedure - stop drinking milk or drinks that contain milk.  2 hours before the procedure - stop drinking clear liquids. Medicines Ask your health care provider about:  Changing or stopping your regular medicines. This is especially important if you are taking diabetes medicines or blood thinners.  Taking medicines such as aspirin and ibuprofen.  These medicines can thin your blood. Do not take these medicines unless your health care provider tells you to take them.  Taking over-the-counter medicines, vitamins, herbs, and supplements. General instructions  Do not use any products that contain nicotine or tobacco for at least 4 weeks before the procedure. These products include cigarettes, e-cigarettes, and chewing tobacco. If you need help quitting, ask your health care provider.  Plan to have someone take you home from the hospital or clinic.  Plan to have a responsible adult care for you for at least 24 hours after you leave the hospital or clinic. This is important.  Ask your health care provider: ? How your surgery site will be marked. ? What steps will be taken to help prevent infection. These may include:  Removing hair at the surgery site.  Washing skin with a germ-killing soap.  Taking antibiotic medicine. What happens during the procedure?  An IV will be inserted into one of your veins.  You will be given one or more of the following: ? A medicine to make you relax (sedative). ? A medicine to make you fall asleep (general anesthetic).  A small incision will be made through the skin of your scrotum.  Your testicle and the hydrocele will be located, and the hydrocele sac will be opened with an incision.  The fluid will be drained from the hydrocele. Part of the hydrocele sac may be removed.  The hydrocele will be closed with stitches that dissolve (absorbable sutures). This prevents fluid from building up again.  If your hydrocele is large, you may have a thin, rubber drain placed to allow fluid to drain  after the procedure.  The incision in your scrotum will be closed with absorbable sutures, skin glue, or adhesives.  A bandage (dressing) will be placed over the incision. The dressing may be held in place with an athletic support strap (scrotal support). The procedure may vary among health care providers and  hospitals. What happens after the procedure? Your blood pressure, heart rate, breathing rate, and blood oxygen level will be monitored until you leave the hospital or clinic. Current Urology, 10(1), 1-14. https://doi.org/[email or web address]">  Hidrocelectoma en los adultos, cuidados posteriores Hydrocelectomy, Adult, Care After Esta hoja le brinda informacin sobre cmo cuidarse despus del procedimiento. Su mdico tambin podr darle instrucciones ms especficas. Comunquese con el mdico si tiene problemas o preguntas. Qu puedo esperar despus del procedimiento? Despus del procedimiento, es comn [employer] siguientes sntomas: Molestias leves e hinchazn en la bolsa que contiene los testculos (escroto). Moretones en el escroto. Siga estas instrucciones en su casa: Medicamentos Use los medicamentos de venta libre y los recetados solamente como se lo haya indicado el mdico. Pregntele al mdico si el medicamento recetado: Hace necesario que evite conducir o usar maquinaria pesada. Puede causarle estreimiento. Es posible que tenga que tomar estas medidas para prevenir o tratar el estreimiento: Product manager suficiente lquido como para Pharmacologist la orina de color amarillo plido. Usar medicamentos recetados o de Sales promotion account executive. Consumir alimentos ricos en fibra, como frijoles, cereales integrales, y frutas y verduras frescas. Limitar el consumo de alimentos ricos en grasa y azcares procesados, como los alimentos fritos o dulces. Baarse No tome baos de inmersin, no nade ni use el jacuzzi hasta que el mdico lo autorice. Pregntele al mdico si puede ducharse. Delle Reining solo le permitan darse baos de [place]. Si le indicaron que use un soporte deportivo (soporte escrotal), mantngalo secoDorena Cookey para ducharse o baarse. Cuidado de la incisin Siga las instrucciones del mdico acerca del cuidado de la incisin. Asegrese de hacer lo siguiente: Lvese las manos con agua y [place] antes y despus  de Multimedia programmer la venda (vendaje). Use desinfectante para manos si no dispone de [place] y [place]. Cambie el vendaje como se lo haya indicado el mdico. No retire los puntos (suturas), la goma para cerrar la piel o las tiras [place]. Es posible que estos cierres cutneos deban quedar puestos en la piel durante 2semanas o ms tiempo. Si los bordes de las tiras [address] empiezan a despegarse y Scientific laboratory technician, puede recortar los que estn sueltos. No retire las tiras [employer] por completo a menos que el mdico se lo indique. Controle la incisin y el escroto todos los [address]  [address] para detectar signos de infeccin. Est atento a los siguientes signos: Aumento del enrojecimiento, la hinchazn o Chief Technology Officer. Lquido o sangre. Calor. Pus o mal olor.   Control del dolor y la hinchazn Si se lo indican, aplique hielo sobre la zona afectada. Para hacer esto: Ponga el hielo en una bolsa plstica. Coloque una toalla entre la piel y Copy. Coloque el hielo durante , 2a3veces al da.   Actividad No haga actividades que demanden mucha energa durante el tiempo que le haya indicado el mdico. No levante ningn objeto que pese ms de 10libras (4.5kg) o que supere el lmite de peso que le hayan indicado, hasta que el mdico le diga que puede [place]. Retome sus actividades normales segn lo indicado por el mdico. Pregntele al mdico qu actividades son seguras para usted. No conduzca durante 24horas si le administraron un sedante durante el procedimiento. Pregntele al mdico cundo es  seguro volver a conducir. Instrucciones generales No consuma ningn producto que contenga nicotina o tabaco, como cigarrillos, cigarrillos electrnicos y tabaco de Theatre manager. Estos pueden retrasar la cicatrizacin de la incisin despus de la ciruga. Si necesita ayuda para dejar de fumar, consulte al mdico. Si le indicaron que use un soporte escrotal, selo como se lo haya indicado el mdico. Si le colocaron un drenaje durante  el procedimiento, deber concurrir a una visita de seguimiento para que se lo quiten. Concurra a todas las visitas de [address] se lo haya indicado el mdico. Esto es importante. Comunquese con un mdico si: El dolor no se alivia con los [employer]. Tiene ms enrojecimiento, hinchazn o dolor alrededor del escroto. Presenta lquido o sangre provenientes de la incisin. Su incisin se siente caliente al tacto. Tiene pus o percibe mal olor que proviene del escroto. Tiene fiebre. Solicite ayuda inmediatamente si: Tiene temblores, escalofros y Psychologist, counselling a 101.8 F (38.8 C). Tiene enrojecimiento o hinchazn que comienza en el escroto y se extiende hacia afuera para cubrir toda la ingle. Experimenta hinchazn en las piernas o dificultad para respirar. Resumen Despus de Counsellor, es frecuente tener molestias leves, hinchazn y moretones. No tome baos de inmersin, no nade ni use el jacuzzi hasta que el mdico lo autorice. Pregntele al mdico si puede ducharse. Si se lo indican, aplique hielo sobre la zona afectada para Engineer, materials y la hinchazn. No realice actividades que demanden mucha energa ni levante nada que pese ms de 10libras (4.5kg) durante el tiempo que le haya indicado el mdico. Si le indicaron que use un soporte escrotal, mantngalo seco. Use el soporte escrotal como se lo haya indicado el mdico. Esta informacin no tiene Theme park manager el consejo del mdico. Asegrese de hacerle al mdico cualquier pregunta que tenga. Document Revised: 04/19/2019 Document Reviewed: 04/19/2019 Elsevier Patient Education  2021 Elsevier Inc.    You will be given pain medicine as needed.  Your IV will be removed, and your insertion site will be checked for bleeding.  Do not drive for 24 hours if you were given a sedative during your procedure.  You may need to wear a scrotal support. This holds the dressing in place and supports your scrotum.    Summary  A hydrocelectomy is a surgical procedure to remove a collection of fluid (hydrocele) from the scrotum, which is the pouch that holds the testicles. You may need to have this procedure if a hydrocele is causing painful swelling in your scrotum.  During the procedure, the hydrocele will be drained and then closed with stitches that dissolve (absorbable sutures). This prevents fluid from building up again.  If your hydrocele is large, you may have a thin, rubber drain placed to allow fluid to drain after the procedure.  You may need to wear a scrotal support after your procedure. This holds the dressing in place and supports your scrotum. This information is not intended to replace advice given to you by your health care provider. Make sure you discuss any questions you have with your health care provider. Document Revised: 03/07/2019 Document Reviewed: 03/07/2019 Elsevier Patient Education  2021 [employer].

## 2021-01-31 NOTE — H&P (View-Only) (Signed)
01/31/2021 10:34 AM   [name] 08-18-1946 [id number]  Referring provider: Preston Fleeting, MD [address] [place],  [place] [address]  Chief Complaint  Patient presents with  . Benign Prostatic Hypertrophy    HPI: 75 year old male with a personal tree of BPH with incomplete bladder emptying, elevated PSA and hydrocele who returns today for routine 40-month follow-up. He is currently on maximal medical therapy with Flomax and finasteride.  PSA had trended back down nicely to 2.60 as of yesterday with the initiation of finasteride.  In addition to above, he underwent prostate MRI in light of rising PSA which indicated approximately79g prostate with no suspicious lesions. Findings were consistent with prostamegaly/enlarged transition zone with nodular extension of the base consistent with BPH, PI-RADS 1 lesion.  Today, he reports that his urinary symptoms are exacerbated by cold weather.  Whenever he is cold, feels that he has to urinate more frequently.  IPSS as below.  He is also worried today about increasing size of his left testicle.  He feels like it is inflamed.  He is a known large left hydrocele.  Bothers increasing.   PSA trend: 2.6 12/20 7.2 04/2019 6.7 04/2019 3.4 01/2012 2.5 02/2011      IPSS    Row Name 01/31/21 1000         International Prostate Symptom Score   How often have you had the sensation of not emptying your bladder? Less than half the time     How often have you had to urinate less than every two hours? Almost always     How often have you found you stopped and started again several times when you urinated? More than half the time     How often have you found it difficult to postpone urination? More than half the time     How often have you had a weak urinary stream? Not at All     How often have you had to strain to start urination? Not at All     How many times did you typically get up at night to urinate? 2 Times      Total IPSS Score 17           Quality of Life due to urinary symptoms   If you were to spend the rest of your life with your urinary condition just the way it is now how would you feel about that? Unhappy          Score:  1-7 Mild 8-19 Moderate 20-35 Severe   PMH: Past Medical History:  Diagnosis Date  . Diabetes mellitus without complication (HCC)   . Hypercholesteremia   . Hypertension     Surgical History: Past Surgical History:  Procedure Laterality Date  . HERNIA REPAIR      Home Medications:  Allergies as of 01/31/2021   No Known Allergies     Medication List       Accurate as of January 31, 2021 10:34 AM. If you have any questions, ask your nurse or doctor.        amLODipine 10 MG tablet Commonly known as: NORVASC   aspirin EC 81 MG tablet Take 81 mg by mouth daily.   atenolol 25 MG tablet Commonly known as: TENORMIN Take 1 tablet (25 mg total) by mouth daily.   atorvastatin 40 MG tablet Commonly known as: LIPITOR Take 40 mg by mouth daily.   CALCIUM 1200 PO Take 1,200 mg by mouth daily.  donepezil 10 MG tablet Commonly known as: ARICEPT Take 10 mg by mouth daily.   finasteride 5 MG tablet Commonly known as: PROSCAR Take 1 tablet (5 mg total) by mouth daily.   furosemide 40 MG tablet Commonly known as: LASIX Take 40 mg by mouth.   meloxicam 15 MG tablet Commonly known as: MOBIC Take 15 mg by mouth as needed.   memantine 5 MG tablet Commonly known as: NAMENDA Take 5 mg by mouth at bedtime.   metFORMIN 500 MG tablet Commonly known as: GLUCOPHAGE Take 500 mg by mouth daily.   multivitamin with minerals tablet Take 1 tablet by mouth daily.   naproxen sodium 220 MG tablet Commonly known as: Aleve Take 1 tablet (220 mg total) by mouth 2 (two) times daily with a meal. What changed:   when to take this  reasons to take this   quinapril 40 MG tablet Commonly known as: ACCUPRIL Take 40 mg by mouth daily.   sildenafil 20 MG  tablet Commonly known as: Revatio Take 1 tablet (20 mg total) by mouth as needed. Take 1-5 tabs as needed prior to intercourse   tamsulosin 0.4 MG Caps capsule Commonly known as: FLOMAX Take 1 capsule (0.4 mg total) by mouth daily.   Vitamin D-3 25 MCG (1000 UT) Caps Take 1,000 Units by mouth daily.       Allergies: No Known Allergies  Family History: Family History  Problem Relation Age of Onset  . Hypertension Mother   . Diabetes Mellitus II Mother     Social History:  reports that he has quit smoking. He has never used smokeless tobacco. He reports that he does not drink alcohol and does not use drugs.   Physical Exam: BP (!) 144/78   Pulse (!) 56   Constitutional:  Alert and oriented, No acute distress.  Accompanied today by his son.  He also has a Engineer, structural via Lawyer available. HEENT: [place] AT, moist mucus membranes.  Trachea midline, no masses. Cardiovascular: No clubbing, cyanosis, or edema. Respiratory: Normal respiratory effort, no increased work of breathing. GI: Abdomen is soft, nontender, nondistended, no abdominal masses GU: Significantly enlarged melon sized left-sided hydrocele.  Right testicle normal.  Phallus somewhat buried. Skin: No rashes, bruises or suspicious lesions. Neurologic: Grossly intact, no focal deficits, moving all 4 extremities. Psychiatric: Normal mood and affect.  Laboratory Data: Lab Results  Component Value Date   WBC 6.6 05/16/2016   HGB 16.9 05/16/2016   HCT 49.0 05/16/2016   MCV 88.4 05/16/2016   PLT 217 05/16/2016    Lab Results  Component Value Date   CREATININE 1.20 09/20/2019    Pertinent Imaging: Results for orders placed or performed in visit on 01/31/21  Bladder Scan (Post Void Residual) in office  Result Value Ref Range   Scan Result 225      Assessment & Plan:    1. Other hydrocele Large left symptomatic hydrocele  Discuss hydrocelectomy again today.  He is now just in pursuing this.  We  discussed risk including risk of bleeding, infection, damage to any structures, about 10% recurrence rate amongst others.  All questions were answered.  Would like him to hold his aspirin 81 mg which he takes for prophylaxis.  All questions answered.  2. Benign prostatic hyperplasia with urinary obstruction Personal history of enlarged prostate and remains symptomatic with incomplete bladder emptying today, PVR 222  We discussed that at this point, he is on maximal medical therapy.  Could consider outlet  procedure such as holep versus TURP versus UroLift.  He is not sure in pursuing this yet.  We will continue to monitor him closely.  Continue Flomax and finasteride for the time being  PSA today  - Bladder Scan (Post Void Residual) in office  3. Incomplete bladder emptying As above   Vanna Scotland, MD  [facility] [address], [address] [place], [place] [address] [phone]

## 2021-02-06 ENCOUNTER — Other Ambulatory Visit: Payer: Self-pay

## 2021-02-06 ENCOUNTER — Other Ambulatory Visit
Admission: RE | Admit: 2021-02-06 | Discharge: 2021-02-06 | Disposition: A | Discharge status: Home / Self Care | Payer: Medicare Other | Source: Ambulatory Visit | Attending: Urology | Admitting: Urology

## 2021-02-06 HISTORY — DX: Dyspnea, unspecified: R06.00

## 2021-02-06 NOTE — Patient Instructions (Signed)
Your procedure is scheduled on: Monday February 17, 2021. Su procedimiento est programado para: Lunes February 17, 2021. Report to Day Surgery inside [facility] 2nd floor (Stop by admissions desk first before getting on elevator). Presntese a: [facility] dentro del [facility] 2do piso (registrese primero en admisiones antes de subir al elevador) To find out your arrival time please call [phone] between 1PM - 3PM on Friday February 14, 2021. Para saber su hora de llegada por favor llame al [phone] Tristan Larson Me la 1PM - 3PM el da: Viernes 22 de Abril del 2022.  Remember: Instructions that are not followed completely may result in serious medical risk, up to and including death,  or upon the discretion of your surgeon and anesthesiologist your surgery may need to be rescheduled.  Recuerde: Las instrucciones que no se siguen completamente Armed forces logistics/support/administrative officer en un riesgo de salud grave, incluyendo hasta  la [place] o a discrecin de su cirujano y Scientific laboratory technician, su ciruga se puede posponer.   __X_ 1.Do not eat food or drink any fluids after midnight the night before your procedure. No    gum chewing or hard candies.     No coma nada ni beba nada despus de la medianoche la noche anterior a su    procedimiento. No coma chicles ni caramelos duros.               _X__ 2.Do Not Smoke or use e-cigarettes For 24 Hours Prior to Your Surgery.    Do not use any chewable tobacco products for at least 6   hours prior to surgery.    No fume ni use cigarrillos electrnicos durante las 24 horas previas    a su [place].  No use ningn producto de tabaco masticable durante   al menos 6 horas antes de la [place].     __X_ 3. No alcohol for 24 hours before or after surgery.    No tome alcohol durante las 24 horas antes ni despus de la [place].   __X__4. On the morning of surgery brush your teeth with toothpaste and water, you                may rinse your mouth with mouthwash if you wish.  Do not  swallow any toothpaste of mouthwash.   En la maana de la [place], cepllese los dientes con pasta de dientes y [place],                [place] enjuagarse la boca con enjuague bucal si lo desea. No ingiera ninguna pasta de dientes o enjuague bucal.   __X__ 5. Notify your doctor if there is any change in your medical condition (cold,fever, infections).    Informe a su mdico si hay algn cambio en su condicin mdica  (resfriado, fiebre, infecciones).   Do not wear jewelry, make-up, hairpins, clips or nail polish.  No use joyas, maquillajes, pinzas/ganchos para el cabello ni esmalte de uas.  Do not wear lotions, powders, or perfumes. You may wear deodorant.  No use lociones, polvos o perfumes.  Puede usar desodorante.    Do not shave 48 hours prior to surgery. Men may shave face and neck.  No se afeite 48 horas antes de la [place].  Los hombres pueden [employer] cara  y el cuello.   Do not bring valuables to the hospital.   No lleve objetos de valor al hospital.  [facility] is not responsible for any belongings or valuables.  [place] no se  hace responsable de ningn tipo de pertenencias u objetos de Licensed conveyancer.               Contacts, dentures or bridgework may not be worn into surgery.  Los lentes de [place], las dentaduras postizas o puentes no se pueden usar en la [place].   Leave your suitcase in the car. After surgery it may be brought to your room.  Deje su maleta en el auto.  Despus de la ciruga podr traerla a su habitacin.   For patients admitted to the hospital, discharge time is determined by your  treatment team.  Para los pacientes que sean ingresados al hospital, el tiempo en el cual se le  dar de alta es determinado por su equipo de [place].   Patients discharged the day of surgery will not be allowed to drive home. A los pacientes que se les da de alta el mismo da de la ciruga no se les permitir conducir a Higher education careers adviser.     __X__ Take these medicines the morning of  surgery with A SIP OF WATER:          Tome estas medicinas la maana de la ciruga con UN SORBO DE AGUA:  1. amLODipine (NORVASC) 10 MG  2. atenolol (TENORMIN) 25 MG  3. atorvastatin (LIPITOR) 40 MG  4. donepezil (ARICEPT) 10 MG      5. finasteride (PROSCAR) 5 MG    ____ Fleet Enema (as directed)          Enema de Fleet (segn lo indicado)    ____ Use CHG Soap as directed          Utilice el jabn de CHG segn lo indicado  ____ Use inhalers on the day of surgery          Use los inhaladores el da de la ciruga  ____ Stop metformin 2 days prior to surgery          Deje de tomar el metformin 2 das antes de la [place]    ____ Take 1/2 of usual insulin dose the night before surgery and none on the morning of surgery           Tome la mitad de la dosis habitual de insulina la noche antes de la [place] y no tome nada en la maana de la             ciruga  __X__ Stop aspirin EC 81 MG as instructed by your doctor.           Deje de tomar  aspirin EC 81 MG como le indico su doctora.  __X__ Stop Anti-inflammatories such as naproxen sodium (ALEVE), Aleve, Motrin, Advil, Goody's or BC powders.          Deje de tomar antiinflamatorios como naproxen sodium (ALEVE), Aleve, Motrin, Advil, Goody's or BC powders.   __X__ Stop supplements until after surgery           Deje de tomar suplementos hasta despus de la ciruga  ____ Bring C-Pap to the hospital          Lleve el C-Pap al hospital

## 2021-02-10 ENCOUNTER — Telehealth: Payer: Self-pay | Admitting: *Deleted

## 2021-02-10 NOTE — Telephone Encounter (Addendum)
Spoke with Windell Moulding, daughter- informed details. She would like to reach out to his neurologist to review concerns undergoing GA and keeping procedure. Will call back after she speaks with neurologist and return call.    ----- Message from Vanna Scotland, MD sent at 02/06/2021  5:43 PM EDT ----- Regarding: RE: Surgery question They are welcome to schedule a f/u to discuss.  Can be done under spinal with sedation but no other options for surgery (as discussed in office, percutaneous drainage is not effective) and local is not sufficient.    Beca, please reach out to daughter and let her know.    Vanna Scotland, MD  ----- Message ----- From: Verlee Monte, NP Sent: 02/06/2021   4:22 PM EDT To: Vanna Scotland, MD Subject: Surgery question                               Dr. Apolinar Junes...  This patient is scheduled for an upcoming hydrocelectomy. Daughter with concerns about him undergoing general anesthesia. She notes that her personal research has found that GA in older adults is associated with exacerbation of an already impaired cognition. He has dementia and she is very worried. Daughter is asking if there are other options for this procedure? Specifically, she is requesting that local/regional anesthesia be considered in her father's case? She is adamant that GA is not really an option for her father, and in the absence of other options, they are contemplating cancelling the procedure all together.  Can you have someone touch base with the patient's daughter to address her concerns?  Respectfully, Quentin Mulling, MSN, APRN, FNP-C, CEN [facility]  Peri-operative Services Nurse Practitioner 02/06/21 4:22 PM

## 2021-02-11 ENCOUNTER — Other Ambulatory Visit: Admission: RE | Admit: 2021-02-11 | Payer: Medicare Other | Source: Ambulatory Visit

## 2021-02-13 ENCOUNTER — Other Ambulatory Visit: Payer: Medicare Other

## 2021-02-15 NOTE — Progress Notes (Shared)
02/15/2021  6:16 PM   Tristan Larson 1946/07/05 [id number]  Referring provider: Preston Fleeting, MD [address],  [place] [address] No chief complaint on file.   HPI: Tristan Larson is a 75 y.o. male with a personal history of BPH with obstruction, elevated PSA, hydrocele, and incomplete bladder emptying, ***who presents today because his daughter got a cystography procedure and would like to discuss some things.  Patient is currently taking Flomax and Finasteride. Since being on this, PSA has trended down to 2.60. PSA trend below  PSA trend: 2.5 - 02/2011 3.4 - 01/2012 6.7 - 04/2019 7.2 - 04/2020 2.6 - 09/2019   He had a prostate MRI which indicated a 79 g prostate without suspicious lesions. These findings were consistent with prostatomegaly/enlarged transition zone with nodular extension of the base, consistent with BPH, PI-RADS 1 lesion.         PMH: Past Medical History:  Diagnosis Date  . Diabetes mellitus without complication (HCC)   . Dyspnea   . Hypercholesteremia   . Hypertension     Surgical History: Past Surgical History:  Procedure Laterality Date  . HERNIA REPAIR  2010    Home Medications:  Allergies as of 02/19/2021   No Known Allergies     Medication List       Accurate as of February 15, 2021  6:16 PM. If you have any questions, ask your nurse or doctor.        amLODipine 10 MG tablet Commonly known as: NORVASC Take 10 mg by mouth daily.   aspirin EC 81 MG tablet Take 81 mg by mouth daily.   atenolol 25 MG tablet Commonly known as: TENORMIN Take 1 tablet (25 mg total) by mouth daily.   atorvastatin 40 MG tablet Commonly known as: LIPITOR Take 40 mg by mouth daily.   donepezil 10 MG tablet Commonly known as: ARICEPT Take 10 mg by mouth daily.   finasteride 5 MG tablet Commonly known as: PROSCAR Take 1 tablet (5 mg total) by mouth daily.   furosemide 40 MG tablet Commonly known as: LASIX Take 40 mg by  mouth daily.   memantine 5 MG tablet Commonly known as: NAMENDA Take 5 mg by mouth at bedtime.   metFORMIN 500 MG tablet Commonly known as: GLUCOPHAGE Take 500 mg by mouth daily.   multivitamin with minerals tablet Take 1 tablet by mouth daily.   naproxen sodium 220 MG tablet Commonly known as: Aleve Take 1 tablet (220 mg total) by mouth 2 (two) times daily with a meal.   quinapril 40 MG tablet Commonly known as: ACCUPRIL Take 40 mg by mouth daily.   sildenafil 20 MG tablet Commonly known as: Revatio Take 1 tablet (20 mg total) by mouth as needed. Take 1-5 tabs as needed prior to intercourse   tamsulosin 0.4 MG Caps capsule Commonly known as: FLOMAX Take 1 capsule (0.4 mg total) by mouth daily. What changed: when to take this   VITAMIN B 12 PO Take by mouth.   Vitamin D-3 125 MCG (5000 UT) Tabs Take 5,000 Units by mouth daily.       Allergies: No Known Allergies  Family History: Family History  Problem Relation Age of Onset  . Hypertension Mother   . Diabetes Mellitus II Mother     Social History:   reports that he has quit smoking. He has never used smokeless tobacco. He reports that he does not drink alcohol and does not use  drugs.  ROS: Pertinent ROS in HPI.  Physical Exam: There were no vitals taken for this visit.  Constitutional:  Alert and oriented, No acute distress. HEENT: [place] AT, moist mucus membranes.  Trachea midline, no masses. Cardiovascular: No clubbing, cyanosis, or edema. Respiratory: Normal respiratory effort, no increased work of breathing. ***GU: Phallus circumcised/uncircumcised without lesions, testes descended bilaterally without masses or tenderness, spermatic cord/epididymis palpably normal bilaterally.  Vasa palpable bilaterally ***Rectal: Prostate is *** grams, no nodules, non-tender, with normal sphincter tone. Skin: No rashes, bruises or suspicious lesions. Neurologic: Grossly intact, no focal deficits, moving all 4  extremities. Psychiatric: Normal mood and affect.  Laboratory Data:  Lab Results  Component Value Date   CREATININE 1.20 09/20/2019     No results found for: PSA   No results found for: TSH   No results found for: TESTOSTERONE   No results found for: HGBA1C   I have reviewed the labs.  Urinalysis    I have reviewed the labs.  Urine Culture:   I have reviewed the labs.  Pertinent Imaging:   No results found for this or any previous visit.    I have personally reviewed the images and agree with radiologist interpretation.    Assessment & Plan:      Follow Up:  No follow-ups on file.   Jonette Pesa, am acting as a scribe for Dr. Vanna Scotland.    [facility] [address] [place], [place] [address] [phone]

## 2021-02-17 ENCOUNTER — Ambulatory Visit: Payer: Medicare Other | Admitting: Urgent Care

## 2021-02-17 ENCOUNTER — Other Ambulatory Visit: Payer: Self-pay

## 2021-02-17 ENCOUNTER — Encounter: Payer: Self-pay | Admitting: Urology

## 2021-02-17 ENCOUNTER — Ambulatory Visit
Admission: RE | Admit: 2021-02-17 | Discharge: 2021-02-17 | Disposition: A | Discharge status: Home / Self Care | Payer: Medicare Other | Attending: Urology | Admitting: Urology

## 2021-02-17 ENCOUNTER — Encounter
Admission: RE | Disposition: A | Discharge status: Home / Self Care | Payer: Self-pay | Source: Home / Self Care | Attending: Urology

## 2021-02-17 ENCOUNTER — Ambulatory Visit: Payer: Medicare Other | Admitting: Registered Nurse

## 2021-02-17 DIAGNOSIS — N401 Enlarged prostate with lower urinary tract symptoms: Secondary | ICD-10-CM | POA: Insufficient documentation

## 2021-02-17 DIAGNOSIS — Z87891 Personal history of nicotine dependence: Secondary | ICD-10-CM | POA: Insufficient documentation

## 2021-02-17 DIAGNOSIS — E7800 Pure hypercholesterolemia, unspecified: Secondary | ICD-10-CM | POA: Diagnosis not present

## 2021-02-17 DIAGNOSIS — N432 Other hydrocele: Secondary | ICD-10-CM

## 2021-02-17 DIAGNOSIS — Z8719 Personal history of other diseases of the digestive system: Secondary | ICD-10-CM | POA: Diagnosis not present

## 2021-02-17 DIAGNOSIS — I10 Essential (primary) hypertension: Secondary | ICD-10-CM | POA: Insufficient documentation

## 2021-02-17 DIAGNOSIS — N139 Obstructive and reflux uropathy, unspecified: Secondary | ICD-10-CM | POA: Diagnosis not present

## 2021-02-17 DIAGNOSIS — Z79899 Other long term (current) drug therapy: Secondary | ICD-10-CM | POA: Diagnosis not present

## 2021-02-17 DIAGNOSIS — Z7984 Long term (current) use of oral hypoglycemic drugs: Secondary | ICD-10-CM | POA: Insufficient documentation

## 2021-02-17 DIAGNOSIS — Z833 Family history of diabetes mellitus: Secondary | ICD-10-CM | POA: Insufficient documentation

## 2021-02-17 DIAGNOSIS — R338 Other retention of urine: Secondary | ICD-10-CM | POA: Diagnosis not present

## 2021-02-17 DIAGNOSIS — Z8249 Family history of ischemic heart disease and other diseases of the circulatory system: Secondary | ICD-10-CM | POA: Diagnosis not present

## 2021-02-17 DIAGNOSIS — Z7982 Long term (current) use of aspirin: Secondary | ICD-10-CM | POA: Diagnosis not present

## 2021-02-17 DIAGNOSIS — N433 Hydrocele, unspecified: Secondary | ICD-10-CM | POA: Diagnosis present

## 2021-02-17 DIAGNOSIS — R339 Retention of urine, unspecified: Secondary | ICD-10-CM | POA: Insufficient documentation

## 2021-02-17 DIAGNOSIS — E119 Type 2 diabetes mellitus without complications: Secondary | ICD-10-CM | POA: Diagnosis not present

## 2021-02-17 HISTORY — PX: HYDROCELE EXCISION: SHX482

## 2021-02-17 LAB — CBC
HCT: 39.8 % (ref 39.0–52.0)
Hemoglobin: 13.3 g/dL (ref 13.0–17.0)
MCH: 30.1 pg (ref 26.0–34.0)
MCHC: 33.4 g/dL (ref 30.0–36.0)
MCV: 90 fL (ref 80.0–100.0)
Platelets: 187 10*3/uL (ref 150–400)
RBC: 4.42 MIL/uL (ref 4.22–5.81)
RDW: 12.5 % (ref 11.5–15.5)
WBC: 5.3 10*3/uL (ref 4.0–10.5)
nRBC: 0 % (ref 0.0–0.2)

## 2021-02-17 LAB — BASIC METABOLIC PANEL
Anion gap: 9 (ref 5–15)
BUN: 27 mg/dL — ABNORMAL HIGH (ref 8–23)
CO2: 25 mmol/L (ref 22–32)
Calcium: 8.9 mg/dL (ref 8.9–10.3)
Chloride: 104 mmol/L (ref 98–111)
Creatinine, Ser: 1.41 mg/dL — ABNORMAL HIGH (ref 0.61–1.24)
GFR, Estimated: 53 mL/min — ABNORMAL LOW (ref >60)
Glucose, Bld: 121 mg/dL — ABNORMAL HIGH (ref 70–99)
Potassium: 4 mmol/L (ref 3.5–5.1)
Sodium: 138 mmol/L (ref 135–145)

## 2021-02-17 LAB — GLUCOSE, CAPILLARY
Glucose-Capillary: 117 mg/dL — ABNORMAL HIGH (ref 70–99)
Glucose-Capillary: 118 mg/dL — ABNORMAL HIGH (ref 70–99)

## 2021-02-17 SURGERY — HYDROCELECTOMY
Anesthesia: Spinal | Site: Scrotum | Laterality: Left

## 2021-02-17 MED ORDER — LIDOCAINE HCL (CARDIAC) PF 100 MG/5ML IV SOSY
PREFILLED_SYRINGE | INTRAVENOUS | Status: DC | PRN
  Administered 2021-02-17: 60 mg via INTRAVENOUS
  Administered 2021-02-17: 40 mg via INTRAVENOUS

## 2021-02-17 MED ORDER — FENTANYL CITRATE (PF) 100 MCG/2ML IJ SOLN: 25.0000 ug | INTRAMUSCULAR | Status: DC | PRN

## 2021-02-17 MED ORDER — BUPIVACAINE IN DEXTROSE 0.75-8.25 % IT SOLN
INTRATHECAL | Status: DC | PRN
  Administered 2021-02-17: 1.2 mL via INTRATHECAL

## 2021-02-17 MED ORDER — CEFAZOLIN SODIUM-DEXTROSE 2-4 GM/100ML-% IV SOLN
INTRAVENOUS | Status: AC
  Filled 2021-02-17: qty 100

## 2021-02-17 MED ORDER — CHLORHEXIDINE GLUCONATE 0.12 % MT SOLN
OROMUCOSAL | Status: AC
  Administered 2021-02-17: 15 mL via OROMUCOSAL
  Filled 2021-02-17: qty 15

## 2021-02-17 MED ORDER — HYDROCODONE-ACETAMINOPHEN 5-325 MG PO TABS: 1.0000 | ORAL_TABLET | Freq: Four times a day (QID) | ORAL | 0 refills | Status: DC | PRN

## 2021-02-17 MED ORDER — EPHEDRINE SULFATE 50 MG/ML IJ SOLN
INTRAMUSCULAR | Status: DC | PRN
  Administered 2021-02-17: 7.5 mg via INTRAVENOUS
  Administered 2021-02-17: 5 mg via INTRAVENOUS
  Administered 2021-02-17: 2.5 mg via INTRAVENOUS

## 2021-02-17 MED ORDER — FAMOTIDINE 20 MG PO TABS
ORAL_TABLET | ORAL | Status: AC
  Administered 2021-02-17: 20 mg via ORAL
  Filled 2021-02-17: qty 1

## 2021-02-17 MED ORDER — PROPOFOL 10 MG/ML IV BOLUS
INTRAVENOUS | Status: DC | PRN
  Administered 2021-02-17: 20 mg via INTRAVENOUS
  Administered 2021-02-17: 30 mg via INTRAVENOUS
  Administered 2021-02-17 (×3): 20 mg via INTRAVENOUS

## 2021-02-17 MED ORDER — ONDANSETRON HCL 4 MG/2ML IJ SOLN: 4.0000 mg | Freq: Once | INTRAMUSCULAR | Status: DC | PRN

## 2021-02-17 MED ORDER — PROPOFOL 500 MG/50ML IV EMUL
INTRAVENOUS | Status: DC | PRN
  Administered 2021-02-17: 120 ug/kg/min via INTRAVENOUS

## 2021-02-17 MED ORDER — PHENYLEPHRINE HCL (PRESSORS) 10 MG/ML IV SOLN
INTRAVENOUS | Status: DC | PRN
  Administered 2021-02-17 (×2): 50 ug via INTRAVENOUS
  Administered 2021-02-17: 100 ug via INTRAVENOUS

## 2021-02-17 MED ORDER — ORAL CARE MOUTH RINSE: 15.0000 mL | Freq: Once | OROMUCOSAL | Status: AC

## 2021-02-17 MED ORDER — CHLORHEXIDINE GLUCONATE 0.12 % MT SOLN: 15.0000 mL | Freq: Once | OROMUCOSAL | Status: AC

## 2021-02-17 MED ORDER — FAMOTIDINE 20 MG PO TABS: 20.0000 mg | ORAL_TABLET | Freq: Once | ORAL | Status: AC

## 2021-02-17 MED ORDER — PROPOFOL 500 MG/50ML IV EMUL
INTRAVENOUS | Status: AC
  Filled 2021-02-17: qty 50

## 2021-02-17 MED ORDER — HYDROCODONE-ACETAMINOPHEN 5-325 MG PO TABS
ORAL_TABLET | ORAL | Status: AC
  Filled 2021-02-17: qty 1

## 2021-02-17 MED ORDER — SODIUM CHLORIDE 0.9 % IV SOLN: INTRAVENOUS | Status: DC

## 2021-02-17 MED ORDER — GLYCOPYRROLATE 0.2 MG/ML IJ SOLN
INTRAMUSCULAR | Status: DC | PRN
  Administered 2021-02-17: .1 mg via INTRAVENOUS

## 2021-02-17 MED ORDER — CEFAZOLIN SODIUM-DEXTROSE 2-4 GM/100ML-% IV SOLN
2.0000 g | INTRAVENOUS | Status: AC
  Administered 2021-02-17: 2 g via INTRAVENOUS

## 2021-02-17 MED ORDER — BUPIVACAINE HCL 0.5 % IJ SOLN
INTRAMUSCULAR | Status: DC | PRN
  Administered 2021-02-17: 10 mL

## 2021-02-17 MED ORDER — HYDROCODONE-ACETAMINOPHEN 5-325 MG PO TABS
1.0000 | ORAL_TABLET | Freq: Once | ORAL | Status: AC
Start: 2021-02-17 — End: 2021-02-17
  Administered 2021-02-17: 1 via ORAL

## 2021-02-17 MED ORDER — GLYCOPYRROLATE 0.2 MG/ML IJ SOLN
INTRAMUSCULAR | Status: AC
  Filled 2021-02-17: qty 1

## 2021-02-17 SURGICAL SUPPLY — 38 items
ADH SKN CLS APL DERMABOND .7 (GAUZE/BANDAGES/DRESSINGS) ×1
APL PRP STRL LF DISP 70% ISPRP (MISCELLANEOUS) ×1
BLADE CLIPPER SURG (BLADE) ×2 IMPLANT
BLADE SURG 15 STRL LF DISP TIS (BLADE) ×1 IMPLANT
BLADE SURG 15 STRL SS (BLADE) ×2
CHLORAPREP W/TINT 26 (MISCELLANEOUS) ×2 IMPLANT
COVER WAND RF STERILE (DRAPES) ×2 IMPLANT
DERMABOND ADVANCED (GAUZE/BANDAGES/DRESSINGS) ×1
DERMABOND ADVANCED .7 DNX12 (GAUZE/BANDAGES/DRESSINGS) ×1 IMPLANT
DRAIN PENROSE 1/4X12 LTX STRL (WOUND CARE) IMPLANT
DRAPE LAPAROTOMY 77X122 PED (DRAPES) ×2 IMPLANT
DRSG GAUZE FLUFF 36X18 (GAUZE/BANDAGES/DRESSINGS) ×2 IMPLANT
ELECT REM PT RETURN 9FT ADLT (ELECTROSURGICAL) ×2
ELECTRODE REM PT RTRN 9FT ADLT (ELECTROSURGICAL) ×1 IMPLANT
GAUZE SPONGE 4X4 12PLY STRL (GAUZE/BANDAGES/DRESSINGS) IMPLANT
GLOVE SURG ENC MOIS LTX SZ6.5 (GLOVE) ×2 IMPLANT
GOWN STRL REUS W/ TWL LRG LVL3 (GOWN DISPOSABLE) ×2 IMPLANT
GOWN STRL REUS W/TWL LRG LVL3 (GOWN DISPOSABLE) ×4
KIT TURNOVER KIT A (KITS) ×2 IMPLANT
LABEL OR SOLS (LABEL) ×1 IMPLANT
MANIFOLD NEPTUNE II (INSTRUMENTS) ×2 IMPLANT
NDL HYPO 25X1 1.5 SAFETY (NEEDLE) ×1 IMPLANT
NEEDLE HYPO 25X1 1.5 SAFETY (NEEDLE) ×2 IMPLANT
NS IRRIG 500ML POUR BTL (IV SOLUTION) ×2 IMPLANT
PACK BASIN MINOR ARMC (MISCELLANEOUS) ×2 IMPLANT
SUPPORETR ATHLETIC LG (MISCELLANEOUS) ×1 IMPLANT
SUPPORTER ATHLETIC LG (MISCELLANEOUS) ×2
SUT CHROMIC 3 0 PS 2 (SUTURE) ×4 IMPLANT
SUT CHROMIC 3 0 SH 27 (SUTURE) IMPLANT
SUT ETHILON 3-0 FS-10 30 BLK (SUTURE)
SUT ETHILON NAB PS2 4-0 18IN (SUTURE) IMPLANT
SUT VIC AB 3-0 SH 27 (SUTURE) ×2
SUT VIC AB 3-0 SH 27X BRD (SUTURE) ×1 IMPLANT
SUT VIC AB 4-0 SH 27 (SUTURE)
SUT VIC AB 4-0 SH 27XANBCTRL (SUTURE) IMPLANT
SUTURE EHLN 3-0 FS-10 30 BLK (SUTURE) IMPLANT
SYR 10ML LL (SYRINGE) ×2 IMPLANT
SYR BULB EAR ULCER 3OZ GRN STR (SYRINGE) ×1 IMPLANT

## 2021-02-17 NOTE — Anesthesia Procedure Notes (Addendum)
Spinal  Patient location during procedure: OR Start time: 02/17/2021 1:00 PM End time: 02/17/2021 1:05 PM Reason for block: surgical anesthesia Staffing Performed: anesthesiologist  Anesthesiologist: Naomie Dean, MD Resident/CRNA: Elmarie Mainland, CRNA Preanesthetic Checklist Completed: patient identified, IV checked, site marked, risks and benefits discussed, surgical consent, monitors and equipment checked, pre-op evaluation and timeout performed Spinal Block Patient position: sitting Prep: DuraPrep Patient monitoring: heart rate, cardiac monitor, continuous pulse ox and blood pressure Approach: midline Location: L3-4 Injection technique: single-shot Needle Needle type: Pencan  Needle gauge: 25 G Needle length: 9 cm Assessment Sensory level: T4 Events: CSF return Additional Notes Attempt x 1 CRNA, MD x 1

## 2021-02-17 NOTE — Anesthesia Preprocedure Evaluation (Addendum)
Anesthesia Evaluation  Patient identified by MRN, date of birth, ID band Patient awake    Reviewed: Allergy & Precautions, NPO status , Patient's Chart, lab work & pertinent test results  History of Anesthesia Complications Negative for: history of anesthetic complications  Airway Mallampati: III       Dental   Pulmonary neg sleep apnea, neg COPD, Not current smoker, former smoker,           Cardiovascular hypertension, Pt. on medications (-) Past MI and (-) CHF (-) dysrhythmias (-) Valvular Problems/Murmurs     Neuro/Psych neg Seizures Dementia    GI/Hepatic neg GERD  ,  Endo/Other  diabetes, Type 2, Oral Hypoglycemic Agents  Renal/GU negative Renal ROS     Musculoskeletal   Abdominal   Peds  Hematology   Anesthesia Other Findings   Reproductive/Obstetrics                            Anesthesia Physical Anesthesia Plan  ASA: III  Anesthesia Plan: Spinal   Post-op Pain Management:    Induction:   PONV Risk Score and Plan:   Airway Management Planned: Nasal Cannula  Additional Equipment:   Intra-op Plan:   Post-operative Plan:   Informed Consent: I have reviewed the patients History and Physical, chart, labs and discussed the procedure including the risks, benefits and alternatives for the proposed anesthesia with the patient or authorized representative who has indicated his/her understanding and acceptance.       Plan Discussed with:   Anesthesia Plan Comments:         Anesthesia Quick Evaluation

## 2021-02-17 NOTE — Discharge Instructions (Signed)
Current Urology, 10(1), 1-14. https://doi.org/10.1159/[email or web address]">  Hidrocelectoma en los adultos, cuidados posteriores Hydrocelectomy, Adult, Care After Esta hoja le brinda informacin sobre cmo cuidarse despus del procedimiento. Su mdico tambin podr darle instrucciones ms especficas. Comunquese con el mdico si tiene problemas o preguntas. Qu puedo esperar despus del procedimiento? Despus del procedimiento, es comn [employer] siguientes sntomas:  Molestias leves e hinchazn en la bolsa que contiene los testculos (escroto).  Moretones en el escroto. Siga estas instrucciones en su casa: Medicamentos  Use los medicamentos de venta libre y los recetados solamente como se lo haya indicado el mdico.  Pregntele al mdico si el medicamento recetado: ? Hace necesario que evite conducir o usar maquinaria pesada. ? Puede causarle estreimiento. Es posible que tenga que tomar estas medidas para prevenir o tratar el estreimiento:  Product manager suficiente lquido como para Pharmacologist la orina de color amarillo plido.  Usar medicamentos recetados o de Sales promotion account executive.  Consumir alimentos ricos en fibra, como frijoles, cereales integrales, y frutas y verduras frescas.  Limitar el consumo de alimentos ricos en grasa y azcares procesados, como los alimentos fritos o dulces. Baarse  No tome baos de inmersin, no nade ni use el jacuzzi hasta que el mdico lo autorice. Pregntele al mdico si puede ducharse. Delle Reining solo le permitan darse baos de [place].  Si le indicaron que use un soporte deportivo (soporte escrotal), mantngalo secoDorena Cookey para ducharse o baarse. Cuidado de la incisin  Siga las instrucciones del mdico acerca del cuidado de la incisin. Asegrese de hacer lo siguiente: ? Lvese las manos con agua y [place] antes y despus de cambiar la venda (vendaje). Use desinfectante para manos si no dispone de [place] y [place]. ? Cambie el vendaje como se lo haya indicado el  mdico. ? No retire los puntos (suturas), la goma para cerrar la piel o las tiras [place]. Es posible que estos cierres cutneos deban quedar puestos en la piel durante 2semanas o ms tiempo. Si los bordes de las tiras [address] empiezan a despegarse y Scientific laboratory technician, puede recortar los que estn sueltos. No retire las tiras [employer] por completo a menos que el mdico se lo indique.  Controle la incisin y el escroto todos los [address]  [address] para detectar signos de infeccin. Est atento a los siguientes signos: ? Aumento del enrojecimiento, la hinchazn o Chief Technology Officer. ? Lquido o sangre. ? Calor. ? Pus o mal olor.   Control del dolor y la hinchazn Si se lo indican, aplique hielo sobre la zona afectada. Para hacer esto:  Ponga el hielo en una bolsa plstica.  Coloque una toalla entre la piel y Copy.  Coloque el hielo durante , 2a3veces al da.   Actividad  No haga actividades que demanden mucha energa durante el tiempo que le haya indicado el mdico.  No levante ningn objeto que pese ms de 10libras (4.5kg) o que supere el lmite de peso que le hayan indicado, hasta que el mdico le diga que puede [place].  Retome sus actividades normales segn lo indicado por el mdico. Pregntele al mdico qu actividades son seguras para usted.  No conduzca durante 24horas si le administraron un sedante durante el procedimiento.  Pregntele al mdico cundo es seguro volver a Science writer. Instrucciones generales  No consuma ningn producto que contenga nicotina o tabaco, como cigarrillos, cigarrillos electrnicos y tabaco de Theatre manager. Estos pueden retrasar la cicatrizacin de la incisin despus de la ciruga. Si necesita ayuda para dejar de fumar, consulte al mdico.  Si le indicaron que  use un soporte escrotal, selo como se lo haya indicado el mdico.  Si le colocaron un drenaje durante el procedimiento, deber concurrir a una visita de seguimiento para que se lo quiten.  Concurra a  todas las visitas de [address] se lo haya indicado el mdico. Esto es importante. Comunquese con un mdico si:  El dolor no se alivia con los [employer].  Tiene ms enrojecimiento, hinchazn o dolor alrededor del escroto.  Presenta lquido o sangre provenientes de la incisin.  Su incisin se siente caliente al tacto.  Tiene pus o percibe mal olor que proviene del escroto.  Tiene fiebre. Solicite ayuda inmediatamente si:  Tiene temblores, escalofros y Psychologist, counselling a 101.8 F (38.8 C).  Tiene enrojecimiento o hinchazn que comienza en el escroto y se extiende hacia afuera para cubrir toda la ingle.  Experimenta hinchazn en las piernas o dificultad para respirar. Resumen  Despus de Counsellor, es frecuente tener molestias leves, hinchazn y moretones.  No tome baos de inmersin, no nade ni use el jacuzzi hasta que el mdico lo autorice. Pregntele al mdico si puede ducharse.  Si se lo indican, aplique hielo sobre la zona afectada para Engineer, materials y la hinchazn.  No realice actividades que demanden mucha energa ni levante nada que pese ms de 10libras (4.5kg) durante el tiempo que le haya indicado el mdico.  Si le indicaron que use un soporte escrotal, mantngalo seco. Use el soporte escrotal como se lo haya indicado el mdico. Esta informacin no tiene Theme park manager el consejo del mdico. Asegrese de hacerle al mdico cualquier pregunta que tenga. Document Revised: 04/19/2019 Document Reviewed: 04/19/2019 Elsevier Patient Education  2021 [employer].

## 2021-02-17 NOTE — Progress Notes (Signed)
Bladder scan done due to pt having a spinal. noted at this time. Dr. Apolinar Junes notified. She requested that we wait at least another hour or so to see if pt could void on his own as he has a large prostate and there is concern for trauma to the prostate with In & Out catheter.

## 2021-02-17 NOTE — Anesthesia Postprocedure Evaluation (Signed)
Anesthesia Post Note  Patient: Tristan Larson  Procedure(s) Performed: HYDROCELECTOMY ADULT (Left Scrotum)  Patient location during evaluation: PACU Anesthesia Type: Spinal Level of consciousness: awake and alert Pain management: pain level controlled Vital Signs Assessment: post-procedure vital signs reviewed and stable Respiratory status: spontaneous breathing and respiratory function stable Cardiovascular status: stable Anesthetic complications: no   No complications documented.   Last Vitals:  Vitals:   02/17/21 1410 02/17/21 1415  BP: 91/61 104/64  Pulse:  87  Resp: (!) 24 (!) 22  Temp: (!) 36.1 C   SpO2: 96% 96%    Last Pain:  Vitals:   02/17/21 1410  PainSc: 0-No pain                 Esperansa Sarabia K

## 2021-02-17 NOTE — OR Nursing (Signed)
Bladder scan @ 5:30 pm - 750cc; void @ 5:40 = 350cc; discussed with Dr. Apolinar Junes via tele - advised pt may d/c to home & to instruct pt to present to office 1st thing in am if unable to void at home; also instruct pt to instruct to ED any issues not being able to void at home - done by S. Iris Pert, RN to pt and family.

## 2021-02-17 NOTE — Transfer of Care (Signed)
Immediate Anesthesia Transfer of Care Note  Patient: Tristan Larson  Procedure(s) Performed: HYDROCELECTOMY ADULT (Left Scrotum)  Patient Location: PACU  Anesthesia Type:General  Level of Consciousness: drowsy  Airway & Oxygen Therapy: Patient Spontanous Breathing and Patient connected to face mask oxygen  Post-op Assessment: Report given to RN and Post -op Vital signs reviewed and stable  Post vital signs: Reviewed and stable  Last Vitals:  Vitals Value Taken Time  BP 91/61 02/17/21 1410  Temp    Pulse 88 02/17/21 1413  Resp    SpO2 100 % 02/17/21 1413  Vitals shown include unvalidated device data.  Last Pain:  Vitals:   02/17/21 1123  PainSc: 0-No pain         Complications: No complications documented.

## 2021-02-17 NOTE — Interval H&P Note (Signed)
History and Physical Interval Note:  02/17/2021 12:32 PM  Tristan Larson  has presented today for surgery, with the diagnosis of Left Hydrocele.  The various methods of treatment have been discussed with the patient and family. After consideration of risks, benefits and other options for treatment, the patient has consented to  Procedure(s): HYDROCELECTOMY ADULT (Left) as a surgical intervention.  The patient's history has been reviewed, patient examined, no change in status, stable for surgery.  I have reviewed the patient's chart and labs.  Questions were answered to the patient's satisfaction.    RRR CTAB  Vanna Scotland

## 2021-02-17 NOTE — Op Note (Signed)
Date of procedure: 02/17/21  Preoperative diagnosis:  1. Left hydrocele  Postoperative diagnosis:  1. Left hydrocele  Procedure: 1. Left hydrocelectomy  Surgeon: Vanna Scotland, MD  Anesthesia: Spinal with sedation  Complications: None  Intraoperative findings: 630 cc of straw-colored fluid drained from left hydrocele sac.  EBL: Minimal  Specimens: None  Drains: None  Indication: Tristan Larson is a 75 y.o. patient with symptomatic large left hydrocele.  After reviewing the management options for treatment, he elected to proceed with the above surgical procedure(s). We have discussed the potential benefits and risks of the procedure, side effects of the proposed treatment, the likelihood of the patient achieving the goals of the procedure, and any potential problems that might occur during the procedure or recuperation. Informed consent has been obtained.  Description of procedure:  The patient was taken to the operating room and spinal anesthesia was administered. The patient was placed in the supine position, prepped and draped in the usual sterile fashion, and preoperative antibiotics were administered. A preoperative time-out was performed.   An approximately 4 cm long incision was created in a transverse plane across the left hemiscrotum.  The subcutaneous tissues were opened using Bovie electrocautery.  The wound was instilled with 10 cc of Marcaine.  Dissection was seen down to the hydrocele sac which was then delivered through the incision into the field.  Additional tissue was bluntly dissected off of the hydrocele sac.  A knife was used to incise a second at this time, 630 cc of straw-colored fluid was drained.  There is a small scrotal pearl noted.  The edges of the sac were then excised using Bovie electrocautery.  Given the very benign appearance of the hydrocele, elected not to send the hydrocele for specimen.  Careful hemostasis was achieved both within the scrotal bed as  well as along the edge of the hydrocele sac with copious use of Bovie electrocautery.  The edges of the hydrocele sac were then everted and reapproximated all the way up to the level of the cord with a loose opening around the cord in order to avoid cord strangulation.  Hemostasis of this point was excellent.  The wound a testicle within the right irrigated.  Testicle was returned back into the left hemiscrotal sac in the appropriate anatomic position.  Dartos layer was closed using running 4-0 Vicryl suture.  The skin edges were reapproximated using a simple interrupted 3-0 chromic suture.  Hemostasis and cosmesis were excellent.  Additional Dermabond was applied.  Scrotal fluffs and scrotal support device were applied.  The patient was then awakened, transferred to the stretcher and taken to PACU in stable condition.  Plan: Normal postoperative wound care.  Follow-up for wound check in 4 weeks.  Vanna Scotland, M.D.

## 2021-02-18 ENCOUNTER — Encounter: Payer: Self-pay | Admitting: Urology

## 2021-02-19 ENCOUNTER — Ambulatory Visit: Payer: Medicare Other | Admitting: Urology

## 2021-02-23 ENCOUNTER — Emergency Department: Payer: Medicare Other

## 2021-02-23 ENCOUNTER — Other Ambulatory Visit: Payer: Self-pay

## 2021-02-23 ENCOUNTER — Emergency Department
Admission: EM | Admit: 2021-02-23 | Discharge: 2021-02-23 | Disposition: A | Discharge status: Home / Self Care | Payer: Medicare Other | Attending: Emergency Medicine | Admitting: Emergency Medicine

## 2021-02-23 ENCOUNTER — Encounter: Payer: Self-pay | Admitting: Emergency Medicine

## 2021-02-23 DIAGNOSIS — Z79899 Other long term (current) drug therapy: Secondary | ICD-10-CM | POA: Diagnosis not present

## 2021-02-23 DIAGNOSIS — E119 Type 2 diabetes mellitus without complications: Secondary | ICD-10-CM | POA: Diagnosis not present

## 2021-02-23 DIAGNOSIS — N5089 Other specified disorders of the male genital organs: Secondary | ICD-10-CM | POA: Insufficient documentation

## 2021-02-23 DIAGNOSIS — N492 Inflammatory disorders of scrotum: Secondary | ICD-10-CM | POA: Diagnosis not present

## 2021-02-23 DIAGNOSIS — N400 Enlarged prostate without lower urinary tract symptoms: Secondary | ICD-10-CM | POA: Insufficient documentation

## 2021-02-23 DIAGNOSIS — N433 Hydrocele, unspecified: Secondary | ICD-10-CM | POA: Insufficient documentation

## 2021-02-23 DIAGNOSIS — I119 Hypertensive heart disease without heart failure: Secondary | ICD-10-CM | POA: Insufficient documentation

## 2021-02-23 DIAGNOSIS — F0390 Unspecified dementia without behavioral disturbance: Secondary | ICD-10-CM | POA: Insufficient documentation

## 2021-02-23 DIAGNOSIS — G8918 Other acute postprocedural pain: Secondary | ICD-10-CM | POA: Diagnosis not present

## 2021-02-23 DIAGNOSIS — Z7982 Long term (current) use of aspirin: Secondary | ICD-10-CM | POA: Diagnosis not present

## 2021-02-23 DIAGNOSIS — Z87891 Personal history of nicotine dependence: Secondary | ICD-10-CM | POA: Insufficient documentation

## 2021-02-23 DIAGNOSIS — Z7984 Long term (current) use of oral hypoglycemic drugs: Secondary | ICD-10-CM | POA: Insufficient documentation

## 2021-02-23 LAB — COMPREHENSIVE METABOLIC PANEL
ALT: 28 U/L (ref 0–44)
AST: 27 U/L (ref 15–41)
Albumin: 3.2 g/dL — ABNORMAL LOW (ref 3.5–5.0)
Alkaline Phosphatase: 47 U/L (ref 38–126)
Anion gap: 8 (ref 5–15)
BUN: 23 mg/dL (ref 8–23)
CO2: 28 mmol/L (ref 22–32)
Calcium: 9 mg/dL (ref 8.9–10.3)
Chloride: 100 mmol/L (ref 98–111)
Creatinine, Ser: 1.85 mg/dL — ABNORMAL HIGH (ref 0.61–1.24)
GFR, Estimated: 38 mL/min — ABNORMAL LOW (ref >60)
Glucose, Bld: 169 mg/dL — ABNORMAL HIGH (ref 70–99)
Potassium: 3.9 mmol/L (ref 3.5–5.1)
Sodium: 136 mmol/L (ref 135–145)
Total Bilirubin: 0.7 mg/dL (ref 0.3–1.2)
Total Protein: 7.3 g/dL (ref 6.5–8.1)

## 2021-02-23 LAB — CBC WITH DIFFERENTIAL/PLATELET
Abs Immature Granulocytes: 0 10*3/uL (ref 0.00–0.07)
Basophils Absolute: 0 10*3/uL (ref 0.0–0.1)
Basophils Relative: 0 %
Eosinophils Absolute: 0.3 10*3/uL (ref 0.0–0.5)
Eosinophils Relative: 5 %
HCT: 37.3 % — ABNORMAL LOW (ref 39.0–52.0)
Hemoglobin: 12.5 g/dL — ABNORMAL LOW (ref 13.0–17.0)
Immature Granulocytes: 0 %
Lymphocytes Relative: 39 %
Lymphs Abs: 2.3 10*3/uL (ref 0.7–4.0)
MCH: 30.3 pg (ref 26.0–34.0)
MCHC: 33.5 g/dL (ref 30.0–36.0)
MCV: 90.3 fL (ref 80.0–100.0)
Monocytes Absolute: 0.8 10*3/uL (ref 0.1–1.0)
Monocytes Relative: 14 %
Neutro Abs: 2.4 10*3/uL (ref 1.7–7.7)
Neutrophils Relative %: 42 %
Platelets: 243 10*3/uL (ref 150–400)
RBC: 4.13 MIL/uL — ABNORMAL LOW (ref 4.22–5.81)
RDW: 12.5 % (ref 11.5–15.5)
WBC: 5.8 10*3/uL (ref 4.0–10.5)
nRBC: 0 % (ref 0.0–0.2)

## 2021-02-23 LAB — URINALYSIS, COMPLETE (UACMP) WITH MICROSCOPIC
Bacteria, UA: NONE SEEN
Bilirubin Urine: NEGATIVE
Glucose, UA: NEGATIVE mg/dL
Ketones, ur: NEGATIVE mg/dL
Leukocytes,Ua: NEGATIVE
Nitrite: NEGATIVE
Protein, ur: 100 mg/dL — AB
Specific Gravity, Urine: 1.017 (ref 1.005–1.030)
pH: 7 (ref 5.0–8.0)

## 2021-02-23 LAB — TROPONIN I (HIGH SENSITIVITY)
Troponin I (High Sensitivity): 5 ng/L (ref <18)
Troponin I (High Sensitivity): 6 ng/L (ref <18)

## 2021-02-23 LAB — LACTIC ACID, PLASMA: Lactic Acid, Venous: 1.4 mmol/L (ref 0.5–1.9)

## 2021-02-23 MED ORDER — TRAMADOL HCL 50 MG PO TABS
50.0000 mg | ORAL_TABLET | Freq: Once | ORAL | Status: AC
  Administered 2021-02-23: 50 mg via ORAL
  Filled 2021-02-23: qty 1

## 2021-02-23 MED ORDER — IOHEXOL 300 MG/ML  SOLN
75.0000 mL | Freq: Once | INTRAMUSCULAR | Status: AC | PRN
  Administered 2021-02-23: 75 mL via INTRAVENOUS

## 2021-02-23 MED ORDER — TRAMADOL HCL 50 MG PO TABS: 50.0000 mg | ORAL_TABLET | Freq: Four times a day (QID) | ORAL | 0 refills | Status: DC | PRN

## 2021-02-23 MED ORDER — DOXYCYCLINE HYCLATE 100 MG PO TABS
100.0000 mg | ORAL_TABLET | Freq: Once | ORAL | Status: AC
  Administered 2021-02-23: 100 mg via ORAL
  Filled 2021-02-23: qty 1

## 2021-02-23 MED ORDER — DOXYCYCLINE HYCLATE 50 MG PO CAPS: 100.0000 mg | ORAL_CAPSULE | Freq: Two times a day (BID) | ORAL | 0 refills | Status: AC

## 2021-02-23 NOTE — ED Notes (Signed)
Patient taken to CT.

## 2021-02-23 NOTE — ED Triage Notes (Signed)
Pt c/o pain, swelling, and bleeding from incision site. Pt had L hydrocelectomy on 4/25. Per son pt has had increasing pain and swelling x several days, today noticed his scrotum stuck to his underwear due to bleeding.

## 2021-02-24 NOTE — ED Provider Notes (Signed)
[facility] Emergency Department Provider Note   ____________________________________________   Event Date/Time   First MD Initiated Contact with Patient 02/23/21 1642     (approximate)  I have reviewed the triage vital signs and the nursing notes.   HISTORY  Chief Complaint Post-op Problem    HPI Tristan Larson is a 75 y.o. male with below stated past medical history who presents after a left hydrocelectomy on 4/25 complaining of increasing pain and swelling as well as a small amount of bleeding to the surgical site in the left hemiscrotum.  Patient describes aching, burning, 8/10 left hemiscrotal pain.  Patient states that this pain is been worsening over the last 3 days and he came in today after he found a small amount of blood in his boxers after sitting upright for prolonged period of time.  Patient feels as though sitting in his computer chair he may have irritated this wound and caused it to open up.  Patient currently denies any vision changes, tinnitus, difficulty speaking, facial droop, sore throat, chest pain, shortness of breath, abdominal pain, nausea/vomiting/diarrhea, dysuria, or weakness/numbness/paresthesias in any extremity         Past Medical History:  Diagnosis Date  . Diabetes mellitus without complication (HCC)   . Dyspnea   . Hypercholesteremia   . Hypertension     Patient Active Problem List   Diagnosis Date Noted  . Memory loss or impairment 06/10/2018  . Mild dementia (HCC) 06/25/2016  . Daytime somnolence 05/26/2016  . Mixed hyperlipidemia 05/26/2016  . Numbness and tingling 05/26/2016  . Chest pain with moderate risk for cardiac etiology 05/16/2016  . Coronary artery calcification seen on CAT scan 05/16/2016  . Essential hypertension 05/16/2016  . Diabetes mellitus type II, non insulin dependent (HCC) 05/16/2016  . Hyperlipidemia associated with type 2 diabetes mellitus (HCC) 05/16/2016  . Hypertensive heart disease  without CHF 05/16/2016    Past Surgical History:  Procedure Laterality Date  . HERNIA REPAIR  2010  . HYDROCELE EXCISION Left 02/17/2021   Procedure: HYDROCELECTOMY ADULT;  Surgeon: Vanna Scotland, MD;  Location: [facility] ORS;  Service: Urology;  Laterality: Left;    Prior to Admission medications   Medication Sig Start Date End Date Taking? Authorizing Provider  doxycycline (VIBRAMYCIN) 50 MG capsule Take 2 capsules (100 mg total) by mouth 2 (two) times daily for 5 days. 02/23/21 02/28/21 Yes Merwyn Katos, MD  traMADol (ULTRAM) 50 MG tablet Take 1 tablet (50 mg total) by mouth every 6 (six) hours as needed. 02/23/21 02/23/22 Yes Merwyn Katos, MD  amLODipine (NORVASC) 10 MG tablet Take 10 mg by mouth daily. 05/03/19   [provider]  aspirin EC 81 MG tablet Take 81 mg by mouth daily.    [provider]  atenolol (TENORMIN) 25 MG tablet Take 1 tablet (25 mg total) by mouth daily. 05/17/16   Adrian Saran, MD  atorvastatin (LIPITOR) 40 MG tablet Take 40 mg by mouth daily. 03/24/16   [provider]  Cholecalciferol (VITAMIN D-3) 125 MCG (5000 UT) TABS Take 5,000 Units by mouth daily.    [provider]  Cyanocobalamin (VITAMIN B 12 PO) Take by mouth.    [provider]  donepezil (ARICEPT) 10 MG tablet Take 10 mg by mouth daily. 02/13/16   [provider]  finasteride (PROSCAR) 5 MG tablet Take 1 tablet (5 mg total) by mouth daily. 02/27/20   Vanna Scotland, MD  furosemide (LASIX) 40 MG tablet Take  40 mg by mouth daily.    [provider]  HYDROcodone-acetaminophen (NORCO/VICODIN) 5-325 MG tablet Take 1-2 tablets by mouth every 6 (six) hours as needed for moderate pain. 02/17/21   Vanna Scotland, MD  memantine (NAMENDA) 5 MG tablet Take 5 mg by mouth at bedtime. 06/27/20   [provider]  metFORMIN (GLUCOPHAGE) 500 MG tablet Take 500 mg by mouth daily. 02/13/16   [provider]  Multiple Vitamins-Minerals (MULTIVITAMIN WITH  MINERALS) tablet Take 1 tablet by mouth daily.    [provider]  naproxen sodium (ALEVE) 220 MG tablet Take 1 tablet (220 mg total) by mouth 2 (two) times daily with a meal. Patient not taking: Reported on 02/03/2021 05/17/16   Adrian Saran, MD  quinapril (ACCUPRIL) 40 MG tablet Take 40 mg by mouth daily. 02/13/16   [provider]  sildenafil (REVATIO) 20 MG tablet Take 1 tablet (20 mg total) by mouth as needed. Take 1-5 tabs as needed prior to intercourse Patient not taking: Reported on 02/03/2021 03/13/20   Vanna Scotland, MD  tamsulosin (FLOMAX) 0.4 MG CAPS capsule Take 1 capsule (0.4 mg total) by mouth daily. Patient taking differently: Take 0.4 mg by mouth in the morning and at bedtime. 02/27/20   Vanna Scotland, MD    Allergies Patient has no known allergies.  Family History  Problem Relation Age of Onset  . Hypertension Mother   . Diabetes Mellitus II Mother     Social History Social History   Tobacco Use  . Smoking status: Former Games developer  . Smokeless tobacco: Never Used  Substance Use Topics  . Alcohol use: No    Alcohol/week: 0.0 standard drinks  . Drug use: No    Review of Systems Constitutional: No fever/chills Eyes: No visual changes. ENT: No sore throat. Cardiovascular: Denies chest pain. Respiratory: Denies shortness of breath. Gastrointestinal: No abdominal pain.  No nausea, no vomiting.  No diarrhea. Genitourinary: Negative for dysuria.  Endorses scrotal pain Musculoskeletal: Negative for acute arthralgias Skin: Negative for rash. Neurological: Negative for headaches, weakness/numbness/paresthesias in any extremity Psychiatric: Negative for suicidal ideation/homicidal ideation   ____________________________________________   PHYSICAL EXAM:  VITAL SIGNS: ED Triage Vitals  Enc Vitals Group     BP 02/23/21 1642 (!) 150/82     Pulse Rate 02/23/21 1642 75     Resp 02/23/21 1642 20     Temp 02/23/21 1642 98.8 F (37.1 C)     Temp  Source 02/23/21 1642 Oral     SpO2 02/23/21 1642 98 %     Weight 02/23/21 1641 230 lb (104.3 kg)     Height 02/23/21 1641 5\' 5"  (1.651 m)     Head Circumference --      Peak Flow --      Pain Score 02/23/21 1641 4     Pain Loc --      Pain Edu? --      Excl. in GC? --    Constitutional: Alert and oriented. Well appearing and in no acute distress. Eyes: Conjunctivae are normal. PERRL. Head: Atraumatic. Nose: No congestion/rhinnorhea. Mouth/Throat: Mucous membranes are moist. Neck: No stridor Cardiovascular: Grossly normal heart sounds.  Good peripheral circulation. Respiratory: Normal respiratory effort.  No retractions. Gastrointestinal: Soft and nontender. No distention. Genitourinary: Enlarged scrotum at baseline per patient as well as edematous phallus.  The left hemiscrotum shows a surgical site that has no apparent sutures with erythema, induration, and tenderness to palpation immediately surrounding the incision without any peritoneal discomfort with  palpation or discoloration Musculoskeletal: No obvious deformities Neurologic:  Normal speech and language. No gross focal neurologic deficits are appreciated. Skin:  Skin is warm and dry. No rash noted. Psychiatric: Mood and affect are normal. Speech and behavior are normal.  ____________________________________________   LABS (all labs ordered are listed, but only abnormal results are displayed)  Labs Reviewed  COMPREHENSIVE METABOLIC PANEL - Abnormal; Notable for the following components:      Result Value   Glucose, Bld 169 (*)    Creatinine, Ser 1.85 (*)    Albumin 3.2 (*)    GFR, Estimated 38 (*)    All other components within normal limits  CBC WITH DIFFERENTIAL/PLATELET - Abnormal; Notable for the following components:   RBC 4.13 (*)    Hemoglobin 12.5 (*)    HCT 37.3 (*)    All other components within normal limits  URINALYSIS, COMPLETE (UACMP) WITH MICROSCOPIC - Abnormal; Notable for the following components:    Color, Urine YELLOW (*)    APPearance CLEAR (*)    Hgb urine dipstick SMALL (*)    Protein, ur 100 (*)    All other components within normal limits  LACTIC ACID, PLASMA  TROPONIN I (HIGH SENSITIVITY)  TROPONIN I (HIGH SENSITIVITY)   ____________________________________________  RADIOLOGY  ED MD interpretation: CT of the pelvis with IV contrast shows diffuse scrotal swelling  Official radiology report(s): No results found.  ____________________________________________   PROCEDURES  Procedure(s) performed (including Critical Care):  Procedures   ____________________________________________   INITIAL IMPRESSION / ASSESSMENT AND PLAN / ED COURSE  As part of my medical decision making, I reviewed the following data within the electronic MEDICAL RECORD NUMBER Nursing notes reviewed and incorporated, Labs reviewed, EKG interpreted, Old chart reviewed, Radiograph reviewed and Notes from prior ED visits reviewed and incorporated      Presentation most consistent with scrotal cellulitis likely due to surgical wound Given History, Exam, and Workup I have low suspicion for Necrotizing Fasciitis, Abscess, Osteomyelitis, DVT or other emergent problem as a cause for this presentation.  Rx: Doxycycline 100 mg twice daily x5 days  Disposition: Discharge. No evidence of serious bacterial illness. Nontoxic appearing, VSS. Low risk for treatment failure based on history. Strict return precautions discussed with patient with full understanding.  Patient has a follow-up appointment in the morning with the urologist that performed this procedure for wound recheck.      ____________________________________________   FINAL CLINICAL IMPRESSION(S) / ED DIAGNOSES  Final diagnoses:  Post-operative pain  Cellulitis of scrotum     ED Discharge Orders         Ordered    doxycycline (VIBRAMYCIN) 50 MG capsule  2 times daily        02/23/21 2042    traMADol (ULTRAM) 50 MG tablet  Every 6  hours PRN        02/23/21 2042           Note:  This document was prepared using Dragon voice recognition software and may include unintentional dictation errors.   Merwyn Katos, MD 02/24/21 2033

## 2021-03-07 ENCOUNTER — Encounter: Payer: Self-pay | Admitting: Urology

## 2021-03-07 ENCOUNTER — Ambulatory Visit (INDEPENDENT_AMBULATORY_CARE_PROVIDER_SITE_OTHER): Payer: Medicare Other | Admitting: Urology

## 2021-03-07 ENCOUNTER — Other Ambulatory Visit: Payer: Self-pay

## 2021-03-07 VITALS — BP 176/93 | HR 76 | Ht 65.0 in | Wt 236.0 lb

## 2021-03-07 DIAGNOSIS — N432 Other hydrocele: Secondary | ICD-10-CM

## 2021-03-07 MED ORDER — TRAMADOL HCL 50 MG PO TABS: 50.0000 mg | ORAL_TABLET | Freq: Four times a day (QID) | ORAL | 0 refills | Status: AC | PRN

## 2021-03-07 NOTE — Progress Notes (Signed)
03/07/2021 5:37 PM   Tristan Larson See [birth year] [id number]  Referring provider: Preston Fleeting, MD [address] [place],  [place] [address]  Chief Complaint  Patient presents with  . Follow-up    Follow up from ER    HPI: 75 year old male with a large left hydrocele who presents today for sooner than expected follow-up.  He underwent the procedure on 02/17/2021.  It was uncomplicated.  He presented to the emergency room on 02/23/2021 for increased scrotal pain and swelling.  He reports that it became red and he was exquisitely tender, more so than the previous days.  He was evaluated in the emergency room and diagnosed with a scrotal cellulitis.  He had a CT scan that showed postoperative changes and scrotal wall thickening.  He was prescribed a course of antibiotics which he is completed.  He reports today that his pain has improved significantly.  The scrotal swelling however persist.  He is accompanied today by his son.   PMH: Past Medical History:  Diagnosis Date  . Diabetes mellitus without complication (HCC)   . Dyspnea   . Hypercholesteremia   . Hypertension     Surgical History: Past Surgical History:  Procedure Laterality Date  . HERNIA REPAIR  2010  . HYDROCELE EXCISION Left 02/17/2021   Procedure: HYDROCELECTOMY ADULT;  Surgeon: Vanna Scotland, MD;  Location: [facility] ORS;  Service: Urology;  Laterality: Left;    Home Medications:  Allergies as of 03/07/2021   No Known Allergies     Medication List       Accurate as of Mar 07, 2021 11:59 PM. If you have any questions, ask your nurse or doctor.        amLODipine 10 MG tablet Commonly known as: NORVASC Take 10 mg by mouth daily.   aspirin EC 81 MG tablet Take 81 mg by mouth daily.   atenolol 25 MG tablet Commonly known as: TENORMIN Take 1 tablet (25 mg total) by mouth daily.   atorvastatin 40 MG tablet Commonly known as: LIPITOR Take 40 mg by mouth daily.   donepezil 10 MG  tablet Commonly known as: ARICEPT Take 10 mg by mouth daily.   finasteride 5 MG tablet Commonly known as: PROSCAR Take 1 tablet (5 mg total) by mouth daily.   furosemide 40 MG tablet Commonly known as: LASIX Take 40 mg by mouth daily.   HYDROcodone-acetaminophen 5-325 MG tablet Commonly known as: NORCO/VICODIN Take 1-2 tablets by mouth every 6 (six) hours as needed for moderate pain.   memantine 5 MG tablet Commonly known as: NAMENDA Take 5 mg by mouth at bedtime.   metFORMIN 500 MG tablet Commonly known as: GLUCOPHAGE Take 500 mg by mouth daily.   multivitamin with minerals tablet Take 1 tablet by mouth daily.   naproxen sodium 220 MG tablet Commonly known as: Aleve Take 1 tablet (220 mg total) by mouth 2 (two) times daily with a meal.   quinapril 40 MG tablet Commonly known as: ACCUPRIL Take 40 mg by mouth daily.   sildenafil 20 MG tablet Commonly known as: Revatio Take 1 tablet (20 mg total) by mouth as needed. Take 1-5 tabs as needed prior to intercourse   tamsulosin 0.4 MG Caps capsule Commonly known as: FLOMAX Take 1 capsule (0.4 mg total) by mouth daily. What changed: when to take this   traMADol 50 MG tablet Commonly known as: Ultram Take 1 tablet (50 mg total) by mouth every 6 (six) hours as needed.  VITAMIN B 12 PO Take by mouth.   Vitamin D-3 125 MCG (5000 UT) Tabs Take 5,000 Units by mouth daily.       Allergies: No Known Allergies  Family History: Family History  Problem Relation Age of Onset  . Hypertension Mother   . Diabetes Mellitus II Mother     Social History:  reports that he has quit smoking. He has never used smokeless tobacco. He reports that he does not drink alcohol and does not use drugs.   Physical Exam: BP (!) 176/93   Pulse 76   Ht 5\' 5"  (1.651 m)   Wt 236 lb (107 kg)   BMI 39.27 kg/m   Constitutional:  Alert and oriented, No acute distress.  Spanish translator is present via today. HEENT: [place] AT,  moist mucus membranes.  Trachea midline, no masses. Cardiovascular: No clubbing, cyanosis, or edema. Respiratory: Normal respiratory effort, no increased work of breathing. GU: Tense left hemiscrotal fluid collection?  Edema versus recurrent hydrocele. Skin: No rashes, bruises or suspicious lesions. Neurologic: Grossly intact, no focal deficits, moving all 4 extremities. Psychiatric: Normal mood and affect. Pertinent Imaging: CT of the pelvis with contrast from 02/23/2021 was reviewed   Assessment & Plan:    1. Other hydrocele Unclear from exam today whether or not this is postoperative swelling versus recurrent hydrocele  Patient was made aware preoperative 04/25/2021 that the failure rate is by 10%  I recommend giving it another 6 weeks or so, reassessing.  Continue scrotal support.  NSAIDs as needed.   Nedra Hai, MD  [facility] [address], [address] [place], [place] [phone]

## 2021-03-17 ENCOUNTER — Ambulatory Visit: Payer: Self-pay | Admitting: Physician Assistant

## 2021-04-18 ENCOUNTER — Encounter: Payer: Self-pay | Admitting: Urology

## 2021-05-06 ENCOUNTER — Encounter: Payer: Self-pay | Admitting: Urology

## 2021-05-06 ENCOUNTER — Ambulatory Visit (INDEPENDENT_AMBULATORY_CARE_PROVIDER_SITE_OTHER): Payer: Medicare Other | Admitting: Urology

## 2021-05-06 ENCOUNTER — Other Ambulatory Visit: Payer: Self-pay

## 2021-05-06 VITALS — BP 165/82 | HR 72 | Wt 237.0 lb

## 2021-05-06 DIAGNOSIS — N433 Hydrocele, unspecified: Secondary | ICD-10-CM

## 2021-05-06 NOTE — Progress Notes (Signed)
05/06/2021 1:25 PM   Tristan Larson Apr 10, 1946 [id number]  Referring provider: Preston Fleeting, MD [address] [place],  [place] [address]  Chief Complaint  Patient presents with   Post-op Follow-up    6wk    HPI: 75 year old male who presents accompanied today by his son and a Engineer, structural for postop.  He underwent left hydrocelectomy on 02/17/2021.  Initially postop, this there was a significant size reduction but he has had a recurrence in the volume.  He was also treated for possible scrotal wall cellulitis postop.  He continues to have symptoms from enlarged heavy full scrotum.  He also has some pulling at the incision site today.  In terms of urinary symptoms, his symptoms are fairly well controlled on Flomax and finasteride.  He is pleased previous notes for details of this.   PMH: Past Medical History:  Diagnosis Date   Diabetes mellitus without complication (HCC)    Dyspnea    Hypercholesteremia    Hypertension     Surgical History: Past Surgical History:  Procedure Laterality Date   HERNIA REPAIR  2010   HYDROCELE EXCISION Left 02/17/2021   Procedure: HYDROCELECTOMY ADULT;  Surgeon: Vanna Scotland, MD;  Location: [facility] ORS;  Service: Urology;  Laterality: Left;    Home Medications:  Allergies as of 05/06/2021   No Known Allergies      Medication List        Accurate as of May 06, 2021  1:25 PM. If you have any questions, ask your nurse or doctor.          STOP taking these medications    HYDROcodone-acetaminophen 5-325 MG tablet Commonly known as: NORCO/VICODIN Stopped by: Vanna Scotland, MD       TAKE these medications    amLODipine 10 MG tablet Commonly known as: NORVASC Take 10 mg by mouth daily.   aspirin EC 81 MG tablet Take 81 mg by mouth daily.   atenolol 25 MG tablet Commonly known as: TENORMIN Take 1 tablet (25 mg total) by mouth daily.   atorvastatin 40 MG tablet Commonly known as: LIPITOR Take 40  mg by mouth daily.   donepezil 10 MG tablet Commonly known as: ARICEPT Take 10 mg by mouth daily.   finasteride 5 MG tablet Commonly known as: PROSCAR Take 1 tablet (5 mg total) by mouth daily.   furosemide 40 MG tablet Commonly known as: LASIX Take 40 mg by mouth daily.   memantine 5 MG tablet Commonly known as: NAMENDA Take 5 mg by mouth at bedtime.   metFORMIN 500 MG tablet Commonly known as: GLUCOPHAGE Take 500 mg by mouth daily.   multivitamin with minerals tablet Take 1 tablet by mouth daily.   naproxen sodium 220 MG tablet Commonly known as: Aleve Take 1 tablet (220 mg total) by mouth 2 (two) times daily with a meal.   quinapril 40 MG tablet Commonly known as: ACCUPRIL Take 40 mg by mouth daily.   sildenafil 20 MG tablet Commonly known as: Revatio Take 1 tablet (20 mg total) by mouth as needed. Take 1-5 tabs as needed prior to intercourse   tamsulosin 0.4 MG Caps capsule Commonly known as: FLOMAX Take 1 capsule (0.4 mg total) by mouth daily. What changed: when to take this   traMADol 50 MG tablet Commonly known as: Ultram Take 1 tablet (50 mg total) by mouth every 6 (six) hours as needed.   VITAMIN B 12 PO Take by mouth.   Vitamin D-3  125 MCG (5000 UT) Tabs Take 5,000 Units by mouth daily.        Allergies: No Known Allergies  Family History: Family History  Problem Relation Age of Onset   Hypertension Mother    Diabetes Mellitus II Mother     Social History:  reports that he has quit smoking. He has never used smokeless tobacco. He reports that he does not drink alcohol and does not use drugs.   Physical Exam: BP (!) 165/82   Pulse 72   Wt 237 lb (107.5 kg)   BMI 39.44 kg/m   Constitutional:  Alert and oriented, No acute distress. HEENT: [place] AT, moist mucus membranes.  Trachea midline, no masses. Cardiovascular: No clubbing, cyanosis, or edema. Respiratory: Normal respiratory effort, no increased work of breathing. GU: Enlarged  slightly bigger than softball sized left recurrent hydrocele.  Incision has healed well at the dependent portion of his hemiscrotum. Skin: No rashes, bruises or suspicious lesions. Neurologic: Grossly intact, no focal deficits, moving all 4 extremities. Psychiatric: Normal mood and affect.  Assessment & Plan:    1. Left hydrocele Exam findings today consistent with recurrent left hydrocele  Given that its been now several months and his exam has stabilized with now well-healed incision and no surrounding edema, it is unlikely to represent reactive changes in most likely represents recurrent/failed hydrocelectomy.  We discussed various options moving forward including redo.  If we try to return to the operating room for repeat procedure, would definitely leave a drain on this occasion.  We discussed that redo hydroceles have less success than primary hydrocelectomy.  We discussed other risk benefits including risk of bleeding, infection, demonstrating structures amongst others as well as the recurrence rates.  All he like to avoid this if at all possible.  He is interested in office aspiration.  He understands that the fluid will more than likely reaccumulate, however over what timeline is unclear.  He understands that this is a temporizing measure and could also lead to infection such as orchitis or pyocele.  We will give him periprocedural antibiotic.  He understands all this but would like to at least try to see how long it lasts as a way to try to avoid hydrocelectomy.  He is very uncomfortable with the size of his scrotum.   Schedule ultrasound-guided hydrocele aspiration  Vanna Scotland, MD  [facility] [address], [address] [place], [place] [address] [phone]

## 2021-05-06 NOTE — Progress Notes (Signed)
KG#[id number]

## 2021-05-21 ENCOUNTER — Encounter: Payer: Self-pay | Admitting: Urology

## 2021-05-21 ENCOUNTER — Other Ambulatory Visit: Payer: Self-pay

## 2021-05-21 ENCOUNTER — Ambulatory Visit (INDEPENDENT_AMBULATORY_CARE_PROVIDER_SITE_OTHER): Payer: Medicare Other | Admitting: Urology

## 2021-05-21 VITALS — BP 158/75 | HR 69 | Wt 239.0 lb

## 2021-05-21 DIAGNOSIS — N433 Hydrocele, unspecified: Secondary | ICD-10-CM | POA: Diagnosis not present

## 2021-05-21 MED ORDER — CEPHALEXIN 250 MG PO CAPS
500.0000 mg | ORAL_CAPSULE | Freq: Once | ORAL | Status: AC
  Administered 2021-05-21: 500 mg via ORAL

## 2021-05-21 NOTE — Progress Notes (Signed)
ID #[id number]

## 2021-05-21 NOTE — Progress Notes (Signed)
   05/21/2021 12:01 PM   Tristan Larson 03/17/1946 [id number]  Referring provider: Preston Fleeting, MD [address] [place],  [place] [address]  Chief Complaint  Patient presents with   Hydrocele Needle Aspiration    HPI: 75 year old male with recurrent left hydrocele who presents today for hydrocele aspiration.  Has been extensively counseled.  All additional questions were answered, Spanish translator present.  Timeout was performed.  BP (!) 158/75   Pulse 69   Wt 239 lb (108.4 kg)   BMI 39.77 kg/m    Procedure: Patient was prepped and draped in standard sterile fashion.  He was given a single dose of p.o. Keflex for surgical prophylaxis.  Using ultrasound guidance, great advanced an 35 [place] Angiocath into the hydrocele.  The needle was seen entering the fluid collection.  I withdrew the needle leaving the catheter only in place.  I then aspirated and approximately 350 cc of slightly blood-tinged orange fluid, none cloudy.  I used ultrasound to ensure that all of the fluid itself had been drained.  There did not appear to be any loculations or residual fluid at the end of the procedure.  It was extremely well-tolerated.   Assessment & Plan:    1. Left hydrocele Status post percutaneous drainage in the office  Well-tolerated  Patient is aware that he will likely have recurrence but unclear over what time interval  Postprocedural precautions reviewed, recommend scrotal support and monitor for infection. - cephALEXin (KEFLEX) capsule 500 mg   Return in 6 months for IPSS/ PVR  Vanna Scotland, MD  [facility] [address] [place], [place] [address] [phone]

## 2021-12-02 NOTE — Progress Notes (Signed)
12/03/21 9:36 AM   Tristan Larson [birth year] [id number]  Referring provider:  Preston Fleeting, MD [address] [place],  [place] [address] Chief Complaint  Patient presents with   Benign Prostatic Hypertrophy     HPI: Tristan Larson is a 76 y.o.male with a personal history of left hydrocele who presents today for 6 month follow-up with IPSS, PSA, and PVR.   He underwent left hydrocelectomy on 02/17/2021. Initially postop, this there was a significant size reduction but he has had a recurrence in the volume.  He was also treated for possible scrotal wall cellulitis postop.  On 04/2021 we aspirated his left hydrocele.  He reports that he had very little to no reaccumulation.  Is very pleased.  The scrotum is now lighter, with less mass and nonbothersome. Marland Kitchen  He reports that he has to void every 2 hours. He also reports that his medications for erections help with his flow.  He is also on Flomax and finasteride.  He also has personal history of elevated PSA which has since trended back to normal after the initiation of finasteride.  PSA trend: 2.6 12/20 7.2  04/2019 6.7 04/2019 3.4 01/2012 2.5 02/2011    IPSS     Row Name 12/03/21 0900         International Prostate Symptom Score   How often have you had the sensation of not emptying your bladder? Less than 1 in 5     How often have you had to urinate less than every two hours? More than half the time     How often have you found you stopped and started again several times when you urinated? Less than half the time     How often have you found it difficult to postpone urination? Less than half the time     How often have you had a weak urinary stream? Less than 1 in 5 times     How often have you had to strain to start urination? Less than 1 in 5 times     How many times did you typically get up at night to urinate? 1 Time     Total IPSS Score 12       Quality of Life due to urinary symptoms   If you were to spend  the rest of your life with your urinary condition just the way it is now how would you feel about that? Pleased              Score:  1-7 Mild 8-19 Moderate 20-35 Severe  PMH: Past Medical History:  Diagnosis Date   Diabetes mellitus without complication (HCC)    Dyspnea    Hypercholesteremia    Hypertension     Surgical History: Past Surgical History:  Procedure Laterality Date   HERNIA REPAIR  2010   HYDROCELE EXCISION Left 02/17/2021   Procedure: HYDROCELECTOMY ADULT;  Surgeon: Vanna Scotland, MD;  Location: [facility] ORS;  Service: Urology;  Laterality: Left;    Home Medications:  Allergies as of 12/03/2021   No Known Allergies      Medication List        Accurate as of December 03, 2021  9:36 AM. If you have any questions, ask your nurse or doctor.          amLODipine 10 MG tablet Commonly known as: NORVASC Take 10 mg by mouth daily.   aspirin EC 81 MG tablet Take 81 mg by mouth daily.  atenolol 25 MG tablet Commonly known as: TENORMIN Take 1 tablet (25 mg total) by mouth daily.   atorvastatin 40 MG tablet Commonly known as: LIPITOR Take 40 mg by mouth daily.   donepezil 10 MG tablet Commonly known as: ARICEPT Take 10 mg by mouth daily.   finasteride 5 MG tablet Commonly known as: PROSCAR Take 1 tablet (5 mg total) by mouth daily.   furosemide 40 MG tablet Commonly known as: LASIX Take 40 mg by mouth daily. What changed: Another medication with the same name was removed. Continue taking this medication, and follow the directions you Larson here. Changed by: Vanna Scotland, MD   memantine 5 MG tablet Commonly known as: NAMENDA Take 5 mg by mouth at bedtime.   metFORMIN 500 MG tablet Commonly known as: GLUCOPHAGE Take 500 mg by mouth daily.   multivitamin with minerals tablet Take 1 tablet by mouth daily.   naproxen sodium 220 MG tablet Commonly known as: Aleve Take 1 tablet (220 mg total) by mouth 2 (two) times daily with a meal.    quinapril 40 MG tablet Commonly known as: ACCUPRIL Take 40 mg by mouth daily.   sildenafil 20 MG tablet Commonly known as: Revatio Take 1 tablet (20 mg total) by mouth as needed. Take 1-5 tabs as needed prior to intercourse   tamsulosin 0.4 MG Caps capsule Commonly known as: FLOMAX Take 1 capsule (0.4 mg total) by mouth daily. What changed: when to take this   traMADol 50 MG tablet Commonly known as: Ultram Take 1 tablet (50 mg total) by mouth every 6 (six) hours as needed.   VITAMIN B 12 PO Take by mouth.   Vitamin D-3 125 MCG (5000 UT) Tabs Take 5,000 Units by mouth daily.        Allergies: No Known Allergies  Family History: Family History  Problem Relation Age of Onset   Hypertension Mother    Diabetes Mellitus II Mother     Social History:  reports that he has quit smoking. He has never used smokeless tobacco. He reports that he does not drink alcohol and does not use drugs.   Physical Exam: BP (!) 198/101    Pulse 62    Ht 5\' 5"  (1.651 m)    Wt 239 lb (108.4 kg)    BMI 39.77 kg/m   Constitutional:  Alert and oriented, No acute distress.  Accompanied by son today. HEENT: [place] AT, moist mucus membranes.  Trachea midline, no masses. Cardiovascular: No clubbing, cyanosis, or edema. Respiratory: Normal respiratory effort, no increased work of breathing. GU: Mild fullness of the left hemiscrotum however, this is fairly soft and the testicle is palpable underneath.  Right hemiscrotum is normal.  Slightly buried phallus. Skin: No rashes, bruises or suspicious lesions. Neurologic: Grossly intact, no focal deficits, moving all 4 extremities. Psychiatric: Normal mood and affect.  Laboratory Data:  Lab Results  Component Value Date   CREATININE 1.85 (H) 02/23/2021    Pertinent Imaging: Results for orders placed or performed in visit on 12/03/21  Bladder Scan (Post Void Residual) in office  Result Value Ref Range   Scan Result 280     Assessment & Plan:     Benign prostatic hyperplasia with urinary obstruction  - Personal history of enlarged prostate and remains symptomatic  - His PVR is stably elevated but is minimally symptomatic without infections or any other sequela - Continue on finasteride and Flomax - Recommend daily Cialis to promote better stream  - PSA; pending  drawn today  - Bladder Scan (Post Void Residual) in office  2. Incomplete bladder emptying  - As above   3. Left hydrocele - Status post percutaneous drainage in the office - Well-tolerated   4. Erectile dysfunction   - As above   Return in 6 months for IPSS, PVR, and PSA   I,Kailey Littlejohn,acting as a scribe for Vanna Scotland, MD.,have documented all relevant documentation on the behalf of Vanna Scotland, MD,as directed by  Vanna Scotland, MD while in the presence of Vanna Scotland, MD.  I have reviewed the above documentation for accuracy and completeness, and I agree with the above.   Vanna Scotland, MD    [facility] [address] [place], [place] [address] [phone]

## 2021-12-03 ENCOUNTER — Ambulatory Visit (INDEPENDENT_AMBULATORY_CARE_PROVIDER_SITE_OTHER): Payer: Medicare Other | Admitting: Urology

## 2021-12-03 ENCOUNTER — Encounter: Payer: Self-pay | Admitting: Urology

## 2021-12-03 ENCOUNTER — Telehealth: Payer: Self-pay | Admitting: Urology

## 2021-12-03 ENCOUNTER — Other Ambulatory Visit: Payer: Self-pay

## 2021-12-03 ENCOUNTER — Other Ambulatory Visit: Payer: Self-pay | Admitting: *Deleted

## 2021-12-03 VITALS — BP 198/101 | HR 62 | Ht 65.0 in | Wt 239.0 lb

## 2021-12-03 DIAGNOSIS — N401 Enlarged prostate with lower urinary tract symptoms: Secondary | ICD-10-CM | POA: Diagnosis not present

## 2021-12-03 DIAGNOSIS — R972 Elevated prostate specific antigen [PSA]: Secondary | ICD-10-CM

## 2021-12-03 DIAGNOSIS — N138 Other obstructive and reflux uropathy: Secondary | ICD-10-CM | POA: Diagnosis not present

## 2021-12-03 LAB — BLADDER SCAN AMB NON-IMAGING: Scan Result: 280

## 2021-12-03 MED ORDER — TAMSULOSIN HCL 0.4 MG PO CAPS: 0.4000 mg | ORAL_CAPSULE | Freq: Every day | ORAL | 3 refills | Status: AC

## 2021-12-03 MED ORDER — FINASTERIDE 5 MG PO TABS: 5.0000 mg | ORAL_TABLET | Freq: Every day | ORAL | 3 refills | Status: AC

## 2021-12-03 MED ORDER — TADALAFIL 20 MG PO TABS: ORAL_TABLET | ORAL | 2 refills | Status: DC

## 2021-12-03 MED ORDER — TADALAFIL 20 MG PO TABS: 20.0000 mg | ORAL_TABLET | Freq: Every day | ORAL | 2 refills | Status: DC | PRN

## 2021-12-03 MED ORDER — TAMSULOSIN HCL 0.4 MG PO CAPS: 0.4000 mg | ORAL_CAPSULE | Freq: Every day | ORAL | 3 refills | Status: DC

## 2021-12-03 MED ORDER — FINASTERIDE 5 MG PO TABS: 5.0000 mg | ORAL_TABLET | Freq: Every day | ORAL | 3 refills | Status: DC

## 2021-12-03 NOTE — Telephone Encounter (Signed)
Sent in RX as requested

## 2021-12-03 NOTE — Telephone Encounter (Signed)
Disregard

## 2021-12-03 NOTE — Telephone Encounter (Signed)
Pt's daughter came in to office this afternoon to bring insurance card.  She would like to have Cialis sent to [facility] on [address] instead of the [facility] pharmacy.  Other RX is fine for Flomax, but would also like to see if we could fill the Finasteride to [facility] pharmacy on [address].

## 2021-12-04 LAB — PSA: Prostate Specific Ag, Serum: 3 ng/mL (ref 0.0–4.0)

## 2021-12-12 ENCOUNTER — Telehealth: Payer: Self-pay | Admitting: *Deleted

## 2021-12-12 DIAGNOSIS — N138 Other obstructive and reflux uropathy: Secondary | ICD-10-CM

## 2021-12-12 NOTE — Telephone Encounter (Addendum)
Spoke with daughter, states he has Cilais 20mg  he has been taking as needed for ED. She wants to know if he should keep this and just half the tablets. States the prescription was expensive.   ----- Message from , MD sent at 12/08/2021  3:46 PM EST ----- Hey, it actually meant for this guy to get switched over to Cialis 5 mg daily.  Can you reach out to his family and see if he would like to pursue this.  We discussed at this dose helps more with urinary symptoms as well rather than just erectile dysfunction.  They would like to switch this over, lets do so.  12/10/2021, MD

## 2021-12-15 ENCOUNTER — Telehealth: Payer: Self-pay

## 2021-12-15 DIAGNOSIS — N401 Enlarged prostate with lower urinary tract symptoms: Secondary | ICD-10-CM

## 2021-12-15 DIAGNOSIS — N138 Other obstructive and reflux uropathy: Secondary | ICD-10-CM

## 2021-12-15 MED ORDER — TADALAFIL 5 MG PO TABS: 5.0000 mg | ORAL_TABLET | Freq: Every day | ORAL | 0 refills | Status: DC

## 2021-12-15 NOTE — Telephone Encounter (Signed)
As per dr Apolinar Junes OK to send. Sent to pharmacy.

## 2021-12-15 NOTE — Telephone Encounter (Signed)
He can break this into 4 pieces and take 1 piece/day.  That would be equivalent to 5 mg.  Please also make sure that we are sending the prescription to the least expensive pharmacy and that they are using the good Rx coupon.  Alternatively, we could send it to his regular pharmacy and see if insurance will pay for the Cialis 5 mg daily as its meant for BPH.  Hollice Espy, MD

## 2021-12-15 NOTE — Telephone Encounter (Signed)
Pt daughter called asking if we are able to send in a 90 day supply of cialis 5 mg

## 2021-12-15 NOTE — Telephone Encounter (Signed)
Sw pt daughter. They would like cialis 5 mg sent to [facility] first to see if it is covered. Pt daughter will call back if not covered.

## 2022-03-25 ENCOUNTER — Ambulatory Visit (INDEPENDENT_AMBULATORY_CARE_PROVIDER_SITE_OTHER): Payer: Medicare Other | Admitting: Urology

## 2022-03-25 VITALS — BP 138/77 | HR 73 | Ht 65.0 in | Wt 235.0 lb

## 2022-03-25 DIAGNOSIS — N433 Hydrocele, unspecified: Secondary | ICD-10-CM

## 2022-03-25 LAB — MICROSCOPIC EXAMINATION: Bacteria, UA: NONE SEEN

## 2022-03-25 LAB — URINALYSIS, COMPLETE
Bilirubin, UA: NEGATIVE
Ketones, UA: NEGATIVE
Leukocytes,UA: NEGATIVE
Nitrite, UA: NEGATIVE
Specific Gravity, UA: 1.02 (ref 1.005–1.030)
Urobilinogen, Ur: 0.2 mg/dL (ref 0.2–1.0)
pH, UA: 6.5 (ref 5.0–7.5)

## 2022-03-25 NOTE — Progress Notes (Signed)
03/25/2022 2:58 PM   Tristan Larson Jul 16, 1946 [id number]  Referring provider:  Theotis Burrow, MD [address] [place],  [place] [address] Chief Complaint  Patient presents with   Hydrocele    HPI: Tristan Larson is a 76 y.o.male with a personal history of left hydrocele who presents today for further evaluation of hydrocele.   He underwent left hydrocelectomy on 02/17/2021. Initially postop, this there was a significant size reduction but he has had a recurrence in the volume.  He was also treated for possible scrotal wall cellulitis postop.  On 04/2021 we aspirated his left hydrocele.   He reports today that his scrotal swelling has returned.  Is only been over the last 2 to 3 weeks.  He denies any testicular pain.  Does report heaviness and bother from this.  He is interested in repeat aspiration.   PMH: Past Medical History:  Diagnosis Date   Diabetes mellitus without complication ([place])    Dyspnea    Hypercholesteremia    Hypertension     Surgical History: Past Surgical History:  Procedure Laterality Date   HERNIA REPAIR  2010   HYDROCELE EXCISION Left 02/17/2021   Procedure: HYDROCELECTOMY ADULT;  Surgeon: Hollice Espy, MD;  Location: [facility] ORS;  Service: Urology;  Laterality: Left;    Home Medications:  Allergies as of 03/25/2022   No Known Allergies      Medication List        Accurate as of Mar 25, 2022  2:58 PM. If you have any questions, ask your nurse or doctor.          amLODipine 10 MG tablet Commonly known as: NORVASC Take 10 mg by mouth daily.   aspirin EC 81 MG tablet Take 81 mg by mouth daily.   atenolol 25 MG tablet Commonly known as: TENORMIN Take 1 tablet (25 mg total) by mouth daily.   atorvastatin 40 MG tablet Commonly known as: LIPITOR Take 40 mg by mouth daily.   donepezil 10 MG tablet Commonly known as: ARICEPT Take 10 mg by mouth daily.   finasteride 5 MG tablet Commonly known as: PROSCAR Take 1 tablet  (5 mg total) by mouth daily.   furosemide 40 MG tablet Commonly known as: LASIX Take 40 mg by mouth daily.   memantine 5 MG tablet Commonly known as: NAMENDA Take 5 mg by mouth at bedtime.   metFORMIN 500 MG tablet Commonly known as: GLUCOPHAGE Take 500 mg by mouth daily.   multivitamin with minerals tablet Take 1 tablet by mouth daily.   naproxen sodium 220 MG tablet Commonly known as: Aleve Take 1 tablet (220 mg total) by mouth 2 (two) times daily with a meal.   quinapril 40 MG tablet Commonly known as: ACCUPRIL Take 40 mg by mouth daily.   tadalafil 5 MG tablet Commonly known as: Cialis Take 1 tablet (5 mg total) by mouth daily.   tamsulosin 0.4 MG Caps capsule Commonly known as: FLOMAX Take 1 capsule (0.4 mg total) by mouth daily.   VITAMIN B 12 PO Take by mouth.   Vitamin D-3 125 MCG (5000 UT) Tabs Take 5,000 Units by mouth daily.        Allergies:  No Known Allergies  Family History: Family History  Problem Relation Age of Onset   Hypertension Mother    Diabetes Mellitus II Mother     Social History:  reports that he has quit smoking. He has never used smokeless tobacco. He reports  that he does not drink alcohol and does not use drugs.   Physical Exam: BP 138/77   Pulse 73   Ht 5\' 5"  (1.651 m)   Wt 235 lb (106.6 kg)   BMI 39.11 kg/m   Constitutional:  Alert and oriented, No acute distress.  Accompanied today by his son. HEENT: [place] AT, moist mucus membranes.  Trachea midline, no masses. Cardiovascular: No clubbing, cyanosis, or edema. Respiratory: Normal respiratory effort, no increased work of breathing. [id number] hemiscrotum mango sized hydrocele  Skin: No rashes, bruises or suspicious lesions. Neurologic: Grossly intact, no focal deficits, moving all 4 extremities. Psychiatric: Normal mood and affect.  Laboratory Data:  Lab Results  Component Value Date   CREATININE 1.85 (H) 02/23/2021   Urinalysis pending  Assessment & Plan:     1. Left hydrocele - Status post percutaneous drainage in the office on 05/21/2021.  - Given previous procedure was fairly durable and his in interested in an office aspiration. We discussed the risk including reoccurrence and infections. Will treat him with prophylactic antibiotics   Plan for ultrasound guided aspiration of hydrocele  Conley Rolls as a scribe for Hollice Espy, MD.,have documented all relevant documentation on the behalf of Hollice Espy, MD,as directed by  Hollice Espy, MD while in the presence of Hollice Espy, MD.  I have reviewed the above documentation for accuracy and completeness, and I agree with the above.   Hollice Espy, MD   [facility] [address], [place], [place] [address] [phone]

## 2022-04-07 ENCOUNTER — Encounter: Payer: Medicare Other | Admitting: Urology

## 2022-04-07 NOTE — Progress Notes (Signed)
 @  Month/Day/Year@ CC: No chief complaint on file.    HPI: Tristan Larson is a 76 y.o. male with a personal history of left hydrocele who presents today for hydrocele aspiration.   He underwent left hydrocelectomy on 02/17/2021. Initially postop, this there was a significant size reduction but he has had a recurrence in the volume.  He was also treated for possible scrotal wall cellulitis postop.  On 04/2021 we aspirated his left hydrocele  Exam on 03/25/2022 showed hemiscrotum mango sized hydrocele.    There were no vitals filed for this visit. NED. A&Ox3.   No respiratory distress   Abd soft, NT, ND Normal phallus with bilateral descended testicles  Cystoscopy Procedure Note  Patient identification was confirmed, informed consent was obtained, and patient was prepped using Betadine solution.  Lidocaine jelly was administered per urethral meatus.     Pre-Procedure: - Inspection reveals a normal caliber ureteral meatus.  Procedure: The flexible cystoscope was introduced without difficulty - No urethral strictures/lesions are present. - {Blank multiple:19197::"Enlarged","Surgically absent","Normal"} prostate *** - {Blank multiple:19197::"Normal","Elevated","Tight"} bladder neck - Bilateral ureteral orifices identified - Bladder mucosa  reveals no ulcers, tumors, or lesions - No bladder stones - No trabeculation  Retroflexion shows ***   Post-Procedure: - Patient tolerated the procedure well   Prostate transrectal ultrasound sizing   Informed consent was obtained after discussing risks/benefits of the procedure.  A time out was performed to ensure correct patient identity.   Pre-Procedure: -Transrectal probe was placed without difficulty -Transrectal Ultrasound performed revealing a *** gm prostate measuring *** x *** x *** cm (length) -No significant hypoechoic or median lobe noted   Assessment/ Plan:   No follow-ups on file.  I,Kailey Littlejohn,acting as a  Neurosurgeon for Vanna Scotland, MD.,have documented all relevant documentation on the behalf of Vanna Scotland, MD,as directed by  Vanna Scotland, MD while in the presence of Vanna Scotland, MD.

## 2022-04-08 ENCOUNTER — Ambulatory Visit (INDEPENDENT_AMBULATORY_CARE_PROVIDER_SITE_OTHER): Payer: Medicare Other | Admitting: Urology

## 2022-04-08 ENCOUNTER — Encounter: Payer: Self-pay | Admitting: Urology

## 2022-04-08 VITALS — BP 175/88 | HR 69 | Ht 65.0 in | Wt 235.0 lb

## 2022-04-08 DIAGNOSIS — N433 Hydrocele, unspecified: Secondary | ICD-10-CM | POA: Diagnosis not present

## 2022-04-08 MED ORDER — CEPHALEXIN 250 MG PO CAPS
500.0000 mg | ORAL_CAPSULE | Freq: Once | ORAL | Status: AC
  Administered 2022-04-08: 500 mg via ORAL

## 2022-04-08 NOTE — Patient Instructions (Addendum)
Hidrocele en adultos Hydrocele, Adult El hidrocele es la acumulacin de lquido en la bolsa de piel flcida que Hidrocelectoma en los adultos, cuidados posteriores Hydrocelectomy, Adult, Care After La siguiente informacin ofrece orientacin sobre cmo cuidarse despus del procedimiento. El mdico tambin podr darle instrucciones ms especficas. Comunquese con el mdico si tiene problemas o preguntas. Qu puedo esperar despus del procedimiento? Despus del procedimiento, es comn [employer] siguientes sntomas: Molestias leves e hinchazn en la bolsa que contiene los testculos (escroto). Moretones en el escroto. Siga estas indicaciones en su casa: Medicamentos Use los medicamentos de venta libre y los recetados solamente como se lo haya indicado el mdico. Pregntele al mdico si el medicamento recetado: Requiere que evite conducir o usar [place]. Puede causarle estreimiento. Es posible que tenga que tomar estas medidas para prevenir o tratar el estreimiento: Electronics engineer suficiente lquido como para Theatre manager la orina de color amarillo plido. Usar medicamentos recetados o de Radio broadcast assistant. Consumir alimentos ricos en fibra, como frijoles, cereales integrales, y frutas y verduras frescas. Limitar el consumo de alimentos ricos en grasa y azcares procesados, como los alimentos fritos o dulces. Baos No tome baos de inmersin, no nade ni use el jacuzzi hasta que el mdico lo autorice. Pregntele al mdico si puede ducharse. Thurston Pounds solo le permitan darse baos de [place]. Si le indicaron que use un soporte deportivo (soporte escrotal), mantngalo secoHoyle Barr para ducharse o baarse. Control del dolor y de la hinchazn Si se lo indican, aplique hielo sobre la zona afectada. Para hacer esto: Ponga el hielo en una bolsa plstica. Coloque una toalla entre la piel y Therapist, nutritional. Aplique el hielo durante 20 minutos, 2 a 3 veces al da. Retire el hielo si la piel se pone de color rojo  brillante. Esto es [employer]. Si no puede sentir dolor, calor o fro, tiene un mayor riesgo de que se dae la zona.  Actividad No levante objetos que pesen ms de 10 libras (4.5 kg) hasta que el [employer] diga que es seguro. Si le administraron un sedante durante el procedimiento, puede afectarlo por varias horas. No conduzca ni opere maquinaria hasta que el mdico le indique que es seguro [place]. Pregntele al mdico cundo es seguro volver a Forensic psychologist. Retome sus actividades normales segn lo indicado por el mdico. Pregntele al mdico qu actividades son seguras para usted. Indicaciones generales No consuma ningn producto que contenga nicotina o tabaco. Estos productos incluyen cigarrillos, tabaco para Higher education careers adviser y aparatos de vapeo, como los Psychologist, sport and exercise. Estos pueden retrasar la cicatrizacin despus de la ciruga. Si necesita ayuda para dejar de consumir estos productos, consulte al [employer]. Si le indicaron que use un soporte escrotal, selo como se lo haya indicado el mdico. Concurra a todas las visitas de seguimiento. Esto es importante. Si le colocaron un drenaje durante el procedimiento, deber concurrir a una visita de seguimiento para que se lo quiten. Comunquese con un mdico si: El dolor no se alivia con los [employer]. Tiene ms enrojecimiento, hinchazn o dolor alrededor del escroto. Presenta lquido o sangre provenientes de la incisin. La incisin est caliente al tacto. Tiene pus o percibe que sale mal olor de su incisin. Tiene fiebre. Solicite ayuda de inmediato si: Tiene temblores, escalofros y Engineer, agricultural a 101.8 F (38.8 C). Tiene enrojecimiento o hinchazn que comienza en el escroto y se extiende hacia afuera a toda la ingle. Alba piernas hinchadas. Tiene dificultad para respirar. Estos sntomas pueden Sales executive. Solicite ayuda de inmediato. Llame al  911. No espere a ver si los sntomas desaparecen. No conduzca por sus propios  medios [employer]. Resumen Despus de una hidrocelectoma, es frecuente tener molestias leves, hinchazn y moretones. No tome baos de inmersin, no nade ni use el jacuzzi hasta que el mdico lo autorice. Pregntele al mdico si puede ducharse. Si se lo indican, aplique hielo sobre la zona afectada para Best boy y la hinchazn. No levante objetos que pesen ms de 10 libras (4.5 kg) hasta que el [employer] diga que es [place]. Retome sus actividades normales segn lo indicado por el mdico. Si le indicaron que use un soporte escrotal, mantngalo seco. Use el soporte escrotal como se lo haya indicado el mdico. Esta informacin no tiene Marine scientist el consejo del mdico. Asegrese de hacerle al mdico cualquier pregunta que tenga. Document Revised: 06/25/2021 Document Reviewed: 06/25/2021 Elsevier Patient Education  [place]os los testculos (escroto). Puede ocurrir en uno o en ambos testculos. Esto puede ocurrir debido a que: La cantidad de lquido producido en el escroto no es absorbido por el resto del cuerpo. Se acumula lquido proveniente del abdomen en el escroto. Normalmente, los testculos se forman en el abdomen y luego bajan al escroto antes del nacimiento. El tubo por el que los testculos bajan al escroto generalmente se cierra despus de que esto ocurre. Si el tubo no se cierra, puede acumularse lquido del abdomen en el escroto. Esto no es Designer, jewellery. Cules son las causas? El hidrocele puede deberse a lo siguiente: Air cabin crew. Una infeccin. Disminucin del flujo sanguneo Artist. La torsin de Arts development officer (torsin testicular). Un defecto congnito. Un tumor o cncer en el testculo. A veces, la causa es desconocida. Cules son los signos o sntomas? El hidrocele se siente como un globo lleno de [place]. Tambin puede sentirse pesado. Otros sntomas pueden incluir los siguientes: Hinchazn del escroto.  Esta puede disminuir al [employer]. Tambin puede notar ms hinchazn por la noche que en la maana. Esto se denomina hidrocele comunicante, en el que el lquido del escroto vuelve a ingresar en la cavidad abdominal cuando la posicin del escroto cambia. Hinchazn de la ingle. Molestia leve en el escroto. Dolor. Este puede aparecer si el hidrocele fue causado por una infeccin o una torsin. Cuanto ms grande sea el hidrocele, ms probabilidades hay de que Risk analyst. La hinchazn tambin puede Engineer, drilling. Cmo se diagnostica? Esta afeccin se puede diagnosticar en funcin de sus antecedentes mdicos y de un examen fsico. Tambin pueden hacerle pruebas, que incluyen las siguientes: Pruebas de diagnstico por imgenes, como una ecografa. Estudio de transiluminacin. Este estudio se realiza en una habitacin oscura, donde se coloca una luz en la piel del escroto. El lquido transparente no impide el paso de la luz, y el escroto se iluminar. Esto ayuda al mdico a distinguir el hidrocele de un tumor. Anlisis de [place]. Cmo se trata? La mayora de los hidroceles desaparecen sin tratamiento. Si no tiene Science writer, Geophysicist/field seismologist un seguimiento minucioso de su afeccin hasta que se manifiesten sntomas o la afeccin desaparezca. Esto se denomina observacin cautelosa o espera vigilante. Si se necesita tratamiento, este puede incluir lo siguiente: Tratar la afeccin subyacente. Esto puede incluir la toma de un antibitico para tratar una infeccin. Realizar una ciruga para evitar que se acumule lquido en el escroto. Realizar una ciruga para drenar el lquido. La ciruga puede incluir lo siguiente: Hidrocelectoma.  Para este procedimiento, se realiza una incisin en el escroto para extraer el lquido. Aspiracin con aguja. Se utiliza una aguja para drenar el lquido. Sin embargo, la acumulacin de lquido regresar rpidamente y Astronomer una infeccin en el escroto.  Esto no se hace con frecuencia. Siga estas indicaciones en su casa: Medicamentos Use los medicamentos de venta libre y los recetados solamente como se lo haya indicado el mdico. Si le recetaron un antibitico, tmelo como se lo haya indicado el mdico. No deje de tomar el antibitico aunque comience a sentirse mejor. Indicaciones generales Vigile el hidrocele para detectar cualquier cambio. Concurra a [place]. Esto es importante. Comunquese con un mdico si: Observa cambios en el hidrocele. La hinchazn del escroto o de la ingle empeora. El hidrocele se enrojece, se endurece, le causa dolor o se vuelve sensible al tacto. Tiene fiebre. Solicite ayuda de inmediato si: Tiene un dolor intenso nuevo, o su dolor empeora. Tiene escalofros. Tiene fiebre alta. Resumen El hidrocele es la acumulacin de lquido en la bolsa de piel flcida que aloja los testculos (escroto). El hidrocele puede causar hinchazn, molestias y Social research officer, government. Por lo general, la causa del hidrocele en los adultos posiblemente se desconozca. Sin embargo, algunas veces es causado por una infeccin o la torsin de Arts development officer. Los hidroceles a menudo desaparecen por s solos. Si un hidrocele causa dolor, podra ser necesario tratar la causa subyacente para Best boy. Esta informacin no tiene Marine scientist el consejo del mdico. Asegrese de hacerle al mdico cualquier pregunta que tenga. Document Revised: 06/25/2021 Document Reviewed: 06/25/2021 Elsevier Patient Education  [place].

## 2022-04-21 ENCOUNTER — Ambulatory Visit: Payer: Medicare Other | Admitting: Urology

## 2022-05-13 ENCOUNTER — Ambulatory Visit (INDEPENDENT_AMBULATORY_CARE_PROVIDER_SITE_OTHER): Payer: Medicare Other | Admitting: Urology

## 2022-05-13 VITALS — BP 165/81 | HR 81 | Ht 65.0 in | Wt 235.0 lb

## 2022-05-13 DIAGNOSIS — N433 Hydrocele, unspecified: Secondary | ICD-10-CM | POA: Diagnosis not present

## 2022-05-13 DIAGNOSIS — N5203 Combined arterial insufficiency and corporo-venous occlusive erectile dysfunction: Secondary | ICD-10-CM | POA: Diagnosis not present

## 2022-05-13 NOTE — Patient Instructions (Signed)
Please call office to let us know if you are taking daily cialis

## 2022-05-13 NOTE — Progress Notes (Signed)
05/13/22 9:01 AM   Tristan Larson 11-Nov-1945 [id number]  Referring provider:  Preston Fleeting, MD [address] [place],  [place] [address] Chief Complaint  Patient presents with   Testicle Pain      HPI: Tristan Larson is a 76 y.o.male with a personal history of left hydrocele, BPH with urinary obstruction, incomplete bladder emptying, and erectile dysfunction, who presents today for further evaluation of genital swelling and concerns about erectile dysfunction.  He is s/p left hydrocelectomy on 02/17/2021. Initially postop, this there was a significant size reduction but he has had a recurrence in the volume.  He was also treated for possible scrotal wall cellulitis postop.  On 04/2021 we aspirated his left hydrocele   Exam on 03/25/2022 showed hemiscrotum mango sized hydrocele.   He returned back to clinic on for a hydrocele aspiration on 04/08/2022.   He is managed on finasteride, Flomax, and Cialis 5 mg daily.  He is accompanied by his son.  Spanish interpreter was offered, both declined.    He has 2 concerns today.  He reports that shortly after his hydrocele was aspirated, its reaccumulated some.  He did not wear compressive underwear this time.  He denies any overt pain.  His second concern today is erectile dysfunction.  He feels like he has decreased ability to maintain and achieve erections.  Has tried sildenafil in the past.  He is unsure whether or not he is taking daily Cialis at this point, has blister packs filled by his pharmacy.   PMH: Past Medical History:  Diagnosis Date   Diabetes mellitus without complication (HCC)    Dyspnea    Hypercholesteremia    Hypertension     Surgical History: Past Surgical History:  Procedure Laterality Date   HERNIA REPAIR  2010   HYDROCELE EXCISION Left 02/17/2021   Procedure: HYDROCELECTOMY ADULT;  Surgeon: Vanna Scotland, MD;  Location: [facility] ORS;  Service: Urology;  Laterality: Left;    Home Medications:   Allergies as of 05/13/2022   No Known Allergies      Medication List        Accurate as of May 13, 2022  9:01 AM. If you have any questions, ask your nurse or doctor.          amLODipine 10 MG tablet Commonly known as: NORVASC Take 10 mg by mouth daily.   aspirin EC 81 MG tablet Take 81 mg by mouth daily.   atenolol 25 MG tablet Commonly known as: TENORMIN Take 1 tablet (25 mg total) by mouth daily.   atorvastatin 40 MG tablet Commonly known as: LIPITOR Take 40 mg by mouth daily.   donepezil 10 MG tablet Commonly known as: ARICEPT Take 10 mg by mouth daily.   finasteride 5 MG tablet Commonly known as: PROSCAR Take 1 tablet (5 mg total) by mouth daily.   furosemide 40 MG tablet Commonly known as: LASIX Take 40 mg by mouth daily.   memantine 5 MG tablet Commonly known as: NAMENDA Take 5 mg by mouth at bedtime.   metFORMIN 500 MG tablet Commonly known as: GLUCOPHAGE Take 500 mg by mouth daily.   multivitamin with minerals tablet Take 1 tablet by mouth daily.   naproxen sodium 220 MG tablet Commonly known as: Aleve Take 1 tablet (220 mg total) by mouth 2 (two) times daily with a meal.   quinapril 40 MG tablet Commonly known as: ACCUPRIL Take 40 mg by mouth daily.   tadalafil 5 MG  tablet Commonly known as: Cialis Take 1 tablet (5 mg total) by mouth daily.   tamsulosin 0.4 MG Caps capsule Commonly known as: FLOMAX Take 1 capsule (0.4 mg total) by mouth daily.   VITAMIN B 12 PO Take by mouth.   Vitamin D-3 125 MCG (5000 UT) Tabs Take 5,000 Units by mouth daily.        Allergies:  No Known Allergies  Family History: Family History  Problem Relation Age of Onset   Hypertension Mother    Diabetes Mellitus II Mother     Social History:  reports that he has quit smoking. He has never used smokeless tobacco. He reports that he does not drink alcohol and does not use drugs.   Physical Exam: BP (!) 165/81   Pulse 81   Ht 5\' 5"  (1.651  m)   Wt 235 lb (106.6 kg)   BMI 39.11 kg/m   Constitutional:  Alert and oriented, No acute distress. HEENT: [place] AT, moist mucus membranes.  Trachea midline, no masses. Cardiovascular: No clubbing, cyanosis, or edema. Respiratory: Normal respiratory effort, no increased work of breathing. GU: Recurrent/ persistent left hydrocele, soft non-tense about softball size  Skin: No rashes, bruises or suspicious lesions. Neurologic: Grossly intact, no focal deficits, moving all 4 extremities. Psychiatric: Normal mood and affect.  Laboratory Data:  Lab Results  Component Value Date   CREATININE 1.85 (H) 02/23/2021    Assessment & Plan:   Left hydrocele  - Persistent// recurrent  - Will hold off on draining until gets to 2-3x current size - Softball sized on exam today soft and non-tense  -Advised to call if and when it needs to be drained again and we can schedule that immediately  Erectile dysfunction  -We discussed the pathophysiology of erectile dysfunction today along with possible contributing factors. Discussed possible treatment options including PDE 5 inhibitors, vacuum erectile device, intracavernosal injection, MUSE, and placement of the inflatable or malleable penile prosthesis for refractory cases.  -In terms of PDE 5 inhibitors, we discussed contraindications for this medication as well as common side effects. Patient was counseled on optimal use. All of his questions were answered in detail. He tried and had no improvement on Cialis. He is not sure what medication he is taking he has taken sildenafil in the past. He is not a good implant candidate, options are limited at this time. He will let 04/25/2021 know if he is taking Cialis if not he will retry on demand Cialis vs. dental fill.  As needed.  Does not seem interested in intracavernosal injections.   Return in 6 months as scheduled  I,Kailey Littlejohn,acting as a scribe for [place], MD.,have documented all relevant  documentation on the behalf of Vanna Scotland, MD,as directed by  Vanna Scotland, MD while in the presence of Vanna Scotland, MD.  I have reviewed the above documentation for accuracy and completeness, and I agree with the above.   Vanna Scotland, MD   [facility] [address], [address] [place], [place] [id number]

## 2022-06-02 ENCOUNTER — Ambulatory Visit: Payer: Medicare Other | Admitting: Urology

## 2022-06-08 ENCOUNTER — Telehealth: Payer: Self-pay | Admitting: Urology

## 2022-06-08 ENCOUNTER — Other Ambulatory Visit: Payer: Self-pay | Admitting: Urology

## 2022-06-08 DIAGNOSIS — N401 Enlarged prostate with lower urinary tract symptoms: Secondary | ICD-10-CM

## 2022-06-08 MED ORDER — TADALAFIL 5 MG PO TABS: 5.0000 mg | ORAL_TABLET | Freq: Every day | ORAL | 0 refills | Status: DC

## 2022-06-08 NOTE — Telephone Encounter (Signed)
Pt's daughter called office and said GoodRx could only be used if RX was for 20mg  Cialis.  [facility] was going to cost $2,000 for a 90 day supply.  He needs to have this ASAP.

## 2022-06-19 ENCOUNTER — Encounter: Payer: Self-pay | Admitting: Urology

## 2022-06-19 NOTE — Telephone Encounter (Signed)
Left VM to return call 

## 2022-06-23 NOTE — Telephone Encounter (Signed)
Pt's daughter walked in at the [place] office today and advised that rx is over $2000.00. I showed daughter good rx coupon for #90 of the 5mg  tablet of Cialis and it was less than $30.00. daughter will [facility] to pick up rx.

## 2022-06-30 NOTE — Telephone Encounter (Signed)
____________________________________________  We are using the 5 mg daily version to treat his BPH symptoms along with his erectile dysfunction.  I have a hard time believing that it is $2000.  Could you please look at good Rx and see if we can find this cheaper somewhere else?  The 20 mg dose would not be appropriate for what were trying to achieve here.   Vanna Scotland, MD

## 2022-08-18 ENCOUNTER — Other Ambulatory Visit: Payer: Self-pay

## 2022-08-18 DIAGNOSIS — N138 Other obstructive and reflux uropathy: Secondary | ICD-10-CM

## 2022-08-18 MED ORDER — TADALAFIL 5 MG PO TABS: 5.0000 mg | ORAL_TABLET | Freq: Every day | ORAL | 0 refills | Status: AC

## 2022-12-08 ENCOUNTER — Ambulatory Visit: Payer: 59 | Admitting: Urology

## 2023-02-18 ENCOUNTER — Other Ambulatory Visit: Payer: Self-pay | Admitting: Family Medicine

## 2023-02-18 DIAGNOSIS — M5416 Radiculopathy, lumbar region: Secondary | ICD-10-CM

## 2023-02-23 ENCOUNTER — Ambulatory Visit: Payer: Medicaid Other

## 2023-03-02 ENCOUNTER — Ambulatory Visit
Admission: RE | Admit: 2023-03-02 | Discharge: 2023-03-02 | Disposition: A | Discharge status: Home / Self Care | Payer: 59 | Source: Ambulatory Visit | Attending: Family Medicine | Admitting: Family Medicine

## 2023-03-02 DIAGNOSIS — M5416 Radiculopathy, lumbar region: Secondary | ICD-10-CM | POA: Insufficient documentation

## 2023-03-11 ENCOUNTER — Telehealth: Payer: Self-pay | Admitting: Neurosurgery

## 2023-03-11 NOTE — Telephone Encounter (Signed)
MRI 03/02/2023 IMPRESSION: 1. 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   2. Subtle anterolisthesis at L4-L5 is associated with chronic disc bulging and moderate chronic facet arthropathy including degenerative facet joint fluid. Borderline spinal stenosis there has increased since the 2016 MRI, but also subtle degenerative synovial cysts project from the right facet into the right neural foramen. Query Right L4 radiculitis.   3. Mild for age lumbar spine degeneration elsewhere.   4. Nonspecific bladder wall thickening, query chronic bladder outlet obstruction or urinary retention   His PCP was supposed to have referred him to neurosurgery. We have not received a referral yet but he has not had PT or injections in the past year. Should he see Dr.Y or PA?

## 2023-03-11 NOTE — Telephone Encounter (Signed)
Is this ok to start with you? No PT or ESIs.

## 2023-03-12 NOTE — Telephone Encounter (Signed)
Left message to call back  

## 2023-03-12 NOTE — Telephone Encounter (Signed)
Notes from PCP have been scanned.

## 2023-03-15 NOTE — Telephone Encounter (Signed)
Left message to call back  

## 2023-03-19 NOTE — Telephone Encounter (Signed)
Per pt's daughter Tristan Larson, her dad wants to hold off on this appointment. They will call back when ready to schedule.

## 2024-05-19 ENCOUNTER — Other Ambulatory Visit: Payer: Self-pay | Admitting: Pain Medicine

## 2024-05-19 DIAGNOSIS — R221 Localized swelling, mass and lump, neck: Secondary | ICD-10-CM

## 2024-05-31 ENCOUNTER — Other Ambulatory Visit

## 2024-06-13 ENCOUNTER — Other Ambulatory Visit

## 2024-06-20 ENCOUNTER — Other Ambulatory Visit

## 2024-07-24 DIAGNOSIS — N433 Hydrocele, unspecified: Secondary | ICD-10-CM | POA: Insufficient documentation

## 2024-07-24 DIAGNOSIS — N529 Male erectile dysfunction, unspecified: Secondary | ICD-10-CM | POA: Insufficient documentation

## 2024-07-24 NOTE — Progress Notes (Unsigned)
   07/28/2024 3:30 PM   Tristan Larson April 12, 1946 [id number]  Reason for visit: Follow up ***   HPI: Initial follow up with me today, previously saw Dr. Penne in 2023  Prior HPI: history of left hydrocele, BPH with urinary obstruction, incomplete bladder emptying, and erectile dysfunction   s/p left hydrocelectomy on 02/17/2021.   - 04/2021 aspirated left hydrocele  - aspiration on 04/08/2022  Physical Exam: There were no vitals taken for this visit.   Constitutional:  Alert and oriented, No acute distress.  GU: ***  Laboratory Data: N/A  Pertinent Imaging: N/A   Assessment & Plan:    ED (erectile dysfunction) of organic origin Assessment & Plan: ? 20mg  cialis    Hydrocele, left Assessment & Plan: Recurrent Left hydrocele s/p left hydrocelectomy on 02/17/2021.   - 04/2021 s/p aspiration  - 03/2022 s/p aspiration        Penne JONELLE Skye, MD  [facility] [address] [place], [place] [address] [phone]

## 2024-07-24 NOTE — Assessment & Plan Note (Signed)
 Historically on 20 mg Cialis  as needed Effective dose, no prior side effects, requesting refill today  - Medication refilled, will send to select [facility] pharmacy for discount

## 2024-07-24 NOTE — Assessment & Plan Note (Signed)
 Recurrent Left hydrocele s/p left hydrocelectomy on 02/17/2021.   - 04/2021 s/p aspiration  - 03/2022 s/p aspiration  Patient is content with current status, perhaps some slight recurrence although not bothersome.  He does not desire any interval exam or aspiration today.

## 2024-07-28 ENCOUNTER — Ambulatory Visit: Admitting: Urology

## 2024-07-28 ENCOUNTER — Encounter: Payer: Self-pay | Admitting: Urology

## 2024-07-28 VITALS — BP 155/82 | HR 76 | Ht 61.0 in | Wt 233.6 lb

## 2024-07-28 DIAGNOSIS — N433 Hydrocele, unspecified: Secondary | ICD-10-CM | POA: Diagnosis not present

## 2024-07-28 DIAGNOSIS — N138 Other obstructive and reflux uropathy: Secondary | ICD-10-CM | POA: Insufficient documentation

## 2024-07-28 DIAGNOSIS — N529 Male erectile dysfunction, unspecified: Secondary | ICD-10-CM

## 2024-07-28 DIAGNOSIS — N401 Enlarged prostate with lower urinary tract symptoms: Secondary | ICD-10-CM | POA: Diagnosis not present

## 2024-07-28 MED ORDER — TADALAFIL 20 MG PO TABS: 20.0000 mg | ORAL_TABLET | Freq: Every day | ORAL | 3 refills | Status: DC | PRN

## 2024-07-28 MED ORDER — TADALAFIL 20 MG PO TABS: 20.0000 mg | ORAL_TABLET | Freq: Every day | ORAL | 3 refills | Status: AC | PRN

## 2024-07-28 NOTE — Assessment & Plan Note (Addendum)
 Chronic BPH/LUTS PVRs 200-300cc, stable  ~79g gland via MRI (2021)  On Flomax /Proscar    He is content with his current urinary habits.  Not overly bothersome, in fact reports better than prior visit.  He does have rare urinary leakage events, seems to be associated with urgency.  I do not think he is having frank continuous overflow incontinence.  PVR today , slightly higher than prior.  Similar to prior visit, I reviewed surgery for BPH and outlet obstruction.  I'd be happy to send him to one of my partners for eval, may need pre-op office cystoscopy, but could review best options for him.  Patient and his daughter elected to hold on this for now.  In the absence of recurrent UTIs, recurrent gross hematuria, renal deterioration-I think it is fine to continue expectant management for now.

## 2025-07-27 ENCOUNTER — Ambulatory Visit: Admitting: Urology
